# Patient Record
Sex: Female | Born: 1960 | Race: White | Hispanic: No | Marital: Married | Smoking: Current every day smoker
Health system: Southern US, Community
[De-identification: ages and names within clinical notes are randomized; demographics above are authoritative.]

## PROBLEM LIST (undated history)

## (undated) DIAGNOSIS — F419 Anxiety disorder, unspecified: Secondary | ICD-10-CM

## (undated) DIAGNOSIS — I10 Essential (primary) hypertension: Secondary | ICD-10-CM

## (undated) DIAGNOSIS — T7840XA Allergy, unspecified, initial encounter: Secondary | ICD-10-CM

## (undated) HISTORY — DX: Allergy, unspecified, initial encounter: T78.40XA

## (undated) HISTORY — DX: Anxiety disorder, unspecified: F41.9

---

## 2003-10-26 HISTORY — PX: ABDOMINAL HYSTERECTOMY: SHX81

## 2004-12-22 ENCOUNTER — Emergency Department (HOSPITAL_COMMUNITY): Admission: EM | Admit: 2004-12-22 | Discharge: 2004-12-23 | Payer: Self-pay | Admitting: *Deleted

## 2010-10-15 ENCOUNTER — Emergency Department (HOSPITAL_BASED_OUTPATIENT_CLINIC_OR_DEPARTMENT_OTHER)
Admission: EM | Admit: 2010-10-15 | Discharge: 2010-10-15 | Payer: Self-pay | Source: Home / Self Care | Admitting: Emergency Medicine

## 2010-11-04 ENCOUNTER — Emergency Department (HOSPITAL_BASED_OUTPATIENT_CLINIC_OR_DEPARTMENT_OTHER)
Admission: EM | Admit: 2010-11-04 | Discharge: 2010-11-04 | Payer: Self-pay | Source: Home / Self Care | Admitting: Emergency Medicine

## 2010-11-09 LAB — GLUCOSE, CAPILLARY: Glucose-Capillary: 107 mg/dL — ABNORMAL HIGH (ref 70–99)

## 2010-11-15 ENCOUNTER — Encounter: Payer: Self-pay | Admitting: Neurosurgery

## 2011-08-12 DIAGNOSIS — Z8614 Personal history of Methicillin resistant Staphylococcus aureus infection: Secondary | ICD-10-CM | POA: Insufficient documentation

## 2011-08-12 DIAGNOSIS — L02219 Cutaneous abscess of trunk, unspecified: Secondary | ICD-10-CM | POA: Insufficient documentation

## 2012-09-18 ENCOUNTER — Other Ambulatory Visit: Payer: Self-pay | Admitting: Internal Medicine

## 2012-09-18 NOTE — Telephone Encounter (Signed)
Not Internal Med pt. 

## 2012-09-24 ENCOUNTER — Other Ambulatory Visit: Payer: Self-pay | Admitting: Internal Medicine

## 2012-09-26 ENCOUNTER — Other Ambulatory Visit: Payer: Self-pay | Admitting: Internal Medicine

## 2015-03-26 ENCOUNTER — Ambulatory Visit (INDEPENDENT_AMBULATORY_CARE_PROVIDER_SITE_OTHER): Payer: 59 | Admitting: Family Medicine

## 2015-03-26 ENCOUNTER — Encounter: Payer: Self-pay | Admitting: Family Medicine

## 2015-03-26 VITALS — BP 108/67 | HR 62 | Temp 97.6°F | Resp 16 | Ht 65.5 in | Wt 174.2 lb

## 2015-03-26 DIAGNOSIS — M549 Dorsalgia, unspecified: Secondary | ICD-10-CM | POA: Diagnosis not present

## 2015-03-26 DIAGNOSIS — I10 Essential (primary) hypertension: Secondary | ICD-10-CM | POA: Diagnosis not present

## 2015-03-26 DIAGNOSIS — R229 Localized swelling, mass and lump, unspecified: Secondary | ICD-10-CM | POA: Diagnosis not present

## 2015-03-26 DIAGNOSIS — G8929 Other chronic pain: Secondary | ICD-10-CM

## 2015-03-26 DIAGNOSIS — R222 Localized swelling, mass and lump, trunk: Secondary | ICD-10-CM

## 2015-03-26 MED ORDER — HYDROCODONE-ACETAMINOPHEN 7.5-325 MG PO TABS: 2.0000 | ORAL_TABLET | Freq: Every day | ORAL | Status: AC

## 2015-03-26 MED ORDER — METOPROLOL TARTRATE 25 MG PO TABS: 25.0000 mg | ORAL_TABLET | Freq: Two times a day (BID) | ORAL | Status: AC
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
## 2015-03-26 NOTE — Patient Instructions (Signed)
Try to wean down to 1 Norco daily Take ibuprofen 200 mg over the counter- can take 3-4 every 8 hours Back Pain, Adult Low back pain is very common. About 1 in 5 people have back pain.The cause of low back pain is rarely dangerous. The pain often gets better over time.About half of people with a sudden onset of back pain feel better in just 2 weeks. About 8 in 10 people feel better by 6 weeks.  CAUSES Some common causes of back pain include:  Strain of the muscles or ligaments supporting the spine.  Wear and tear (degeneration) of the spinal discs.  Arthritis.  Direct injury to the back. DIAGNOSIS Most of the time, the direct cause of low back pain is not known.However, back pain can be treated effectively even when the exact cause of the pain is unknown.Answering your caregiver's questions about your overall health and symptoms is one of the most accurate ways to make sure the cause of your pain is not dangerous. If your caregiver needs more information, he or she may order lab work or imaging tests (X-rays or MRIs).However, even if imaging tests show changes in your back, this usually does not require surgery. HOME CARE INSTRUCTIONS For many people, back pain returns.Since low back pain is rarely dangerous, it is often a condition that people can learn to [facility] their own.   Remain active. It is stressful on the back to sit or stand in one place. Do not sit, drive, or stand in one place for more than 30 minutes at a time. Take short walks on level surfaces as soon as pain allows.Try to increase the length of time you walk each day.  Do not stay in bed.Resting more than 1 or 2 days can delay your recovery.  Do not avoid exercise or work.Your body is made to move.It is not dangerous to be active, even though your back may hurt.Your back will likely heal faster if you return to being active before your pain is gone.  Pay attention to your body when you bend and lift. Many people  have less discomfortwhen lifting if they bend their knees, keep the load close to their bodies,and avoid twisting. Often, the most comfortable positions are those that put less stress on your recovering back.  Find a comfortable position to sleep. Use a firm mattress and lie on your side with your knees slightly bent. If you lie on your back, put a pillow under your knees.  Only take over-the-counter or prescription medicines as directed by your caregiver. Over-the-counter medicines to reduce pain and inflammation are often the most helpful.Your caregiver may prescribe muscle relaxant drugs.These medicines help dull your pain so you can more quickly return to your normal activities and healthy exercise.  Put ice on the injured area.  Put ice in a plastic bag.  Place a towel between your skin and the bag.  Leave the ice on for 15-20 minutes, 03-04 times a day for the first 2 to 3 days. After that, ice and heat may be alternated to reduce pain and spasms.  Ask your caregiver about trying back exercises and gentle massage. This may be of some benefit.  Avoid feeling anxious or stressed.Stress increases muscle tension and can worsen back pain.It is important to recognize when you are anxious or stressed and learn ways to manage it.Exercise is a great option. SEEK MEDICAL CARE IF:  You have pain that is not relieved with rest or medicine.  You have  pain that does not improve in 1 week.  You have new symptoms.  You are generally not feeling well. SEEK IMMEDIATE MEDICAL CARE IF:   You have pain that radiates from your back into your legs.  You develop new bowel or bladder control problems.  You have unusual weakness or numbness in your arms or legs.  You develop nausea or vomiting.  You develop abdominal pain.  You feel faint. Document Released: 10/11/2005 Document Revised: 04/11/2012 Document Reviewed: 02/12/2014 [facility] Patient Information 2015 [place] [place]. This  information is not intended to replace advice given to you by your health care provider. Make sure you discuss any questions you have with your health care provider.

## 2015-03-30 ENCOUNTER — Encounter: Payer: Self-pay | Admitting: Family Medicine

## 2015-04-21 ENCOUNTER — Telehealth: Payer: Self-pay

## 2015-04-21 DIAGNOSIS — R222 Localized swelling, mass and lump, trunk: Secondary | ICD-10-CM

## 2015-04-21 NOTE — Telephone Encounter (Signed)
That will be fine. I have put in the order for the referral.

## 2015-04-21 NOTE — Telephone Encounter (Signed)
Patient is requesting a referral to [facility] Bethanie DickerJoshua Butler [phone]. Per patient she has already seen a general surgeon in regards to her mass on her back. She was told by [facility] she would need to see a dermatologist. She has [facility] and it requires a referral authorization. Patients call back number is [phone]

## 2015-04-21 NOTE — Telephone Encounter (Signed)
Can we refer? 

## 2015-04-22 NOTE — Telephone Encounter (Signed)
Left a detailed message advising pt that referral was sent.

## 2015-05-01 ENCOUNTER — Telehealth: Payer: Self-pay

## 2015-05-01 NOTE — Telephone Encounter (Signed)
Patient states she called yesterday for a refill of HYDROcodone-acetaminophen (NORCO) 7.5-325 MG per tablet  She is on her way to be seen in order to get a refill.

## 2015-05-06 ENCOUNTER — Encounter: Payer: 59 | Admitting: Family Medicine

## 2018-12-31 ENCOUNTER — Emergency Department (HOSPITAL_COMMUNITY): Payer: BLUE CROSS/BLUE SHIELD

## 2018-12-31 ENCOUNTER — Encounter (HOSPITAL_COMMUNITY): Payer: Self-pay | Admitting: Emergency Medicine

## 2018-12-31 ENCOUNTER — Inpatient Hospital Stay (HOSPITAL_COMMUNITY): Payer: BLUE CROSS/BLUE SHIELD

## 2018-12-31 ENCOUNTER — Inpatient Hospital Stay (HOSPITAL_COMMUNITY)
Admission: EM | Admit: 2018-12-31 | Discharge: 2019-01-24 | DRG: 917 | Disposition: E | Payer: BLUE CROSS/BLUE SHIELD | Attending: Pulmonary Disease | Admitting: Pulmonary Disease

## 2018-12-31 DIAGNOSIS — G4700 Insomnia, unspecified: Secondary | ICD-10-CM | POA: Diagnosis present

## 2018-12-31 DIAGNOSIS — J9690 Respiratory failure, unspecified, unspecified whether with hypoxia or hypercapnia: Secondary | ICD-10-CM | POA: Diagnosis not present

## 2018-12-31 DIAGNOSIS — E872 Acidosis: Secondary | ICD-10-CM | POA: Diagnosis present

## 2018-12-31 DIAGNOSIS — T405X1A Poisoning by cocaine, accidental (unintentional), initial encounter: Secondary | ICD-10-CM | POA: Diagnosis present

## 2018-12-31 DIAGNOSIS — E669 Obesity, unspecified: Secondary | ICD-10-CM | POA: Diagnosis present

## 2018-12-31 DIAGNOSIS — Z6831 Body mass index (BMI) 31.0-31.9, adult: Secondary | ICD-10-CM | POA: Diagnosis not present

## 2018-12-31 DIAGNOSIS — I639 Cerebral infarction, unspecified: Secondary | ICD-10-CM | POA: Diagnosis not present

## 2018-12-31 DIAGNOSIS — R740 Nonspecific elevation of levels of transaminase and lactic acid dehydrogenase [LDH]: Secondary | ICD-10-CM | POA: Diagnosis not present

## 2018-12-31 DIAGNOSIS — G9341 Metabolic encephalopathy: Secondary | ICD-10-CM | POA: Diagnosis present

## 2018-12-31 DIAGNOSIS — G931 Anoxic brain damage, not elsewhere classified: Secondary | ICD-10-CM | POA: Diagnosis present

## 2018-12-31 DIAGNOSIS — Z79899 Other long term (current) drug therapy: Secondary | ICD-10-CM

## 2018-12-31 DIAGNOSIS — F1410 Cocaine abuse, uncomplicated: Secondary | ICD-10-CM | POA: Diagnosis present

## 2018-12-31 DIAGNOSIS — Z9289 Personal history of other medical treatment: Secondary | ICD-10-CM

## 2018-12-31 DIAGNOSIS — K5900 Constipation, unspecified: Secondary | ICD-10-CM | POA: Diagnosis present

## 2018-12-31 DIAGNOSIS — R4189 Other symptoms and signs involving cognitive functions and awareness: Secondary | ICD-10-CM | POA: Diagnosis not present

## 2018-12-31 DIAGNOSIS — I10 Essential (primary) hypertension: Secondary | ICD-10-CM | POA: Diagnosis present

## 2018-12-31 DIAGNOSIS — D72829 Elevated white blood cell count, unspecified: Secondary | ICD-10-CM | POA: Diagnosis not present

## 2018-12-31 DIAGNOSIS — Y95 Nosocomial condition: Secondary | ICD-10-CM | POA: Diagnosis present

## 2018-12-31 DIAGNOSIS — Z90710 Acquired absence of both cervix and uterus: Secondary | ICD-10-CM | POA: Diagnosis not present

## 2018-12-31 DIAGNOSIS — N179 Acute kidney failure, unspecified: Secondary | ICD-10-CM

## 2018-12-31 DIAGNOSIS — R569 Unspecified convulsions: Secondary | ICD-10-CM | POA: Diagnosis not present

## 2018-12-31 DIAGNOSIS — I468 Cardiac arrest due to other underlying condition: Secondary | ICD-10-CM | POA: Diagnosis present

## 2018-12-31 DIAGNOSIS — J810 Acute pulmonary edema: Secondary | ICD-10-CM | POA: Diagnosis not present

## 2018-12-31 DIAGNOSIS — F17210 Nicotine dependence, cigarettes, uncomplicated: Secondary | ICD-10-CM | POA: Diagnosis present

## 2018-12-31 DIAGNOSIS — T402X1A Poisoning by other opioids, accidental (unintentional), initial encounter: Secondary | ICD-10-CM | POA: Diagnosis present

## 2018-12-31 DIAGNOSIS — Z9911 Dependence on respirator [ventilator] status: Secondary | ICD-10-CM | POA: Diagnosis not present

## 2018-12-31 DIAGNOSIS — J9601 Acute respiratory failure with hypoxia: Secondary | ICD-10-CM | POA: Diagnosis present

## 2018-12-31 DIAGNOSIS — J15211 Pneumonia due to Methicillin susceptible Staphylococcus aureus: Secondary | ICD-10-CM | POA: Diagnosis present

## 2018-12-31 DIAGNOSIS — K7201 Acute and subacute hepatic failure with coma: Secondary | ICD-10-CM | POA: Diagnosis present

## 2018-12-31 DIAGNOSIS — Z515 Encounter for palliative care: Secondary | ICD-10-CM | POA: Diagnosis not present

## 2018-12-31 DIAGNOSIS — N170 Acute kidney failure with tubular necrosis: Secondary | ICD-10-CM | POA: Diagnosis present

## 2018-12-31 DIAGNOSIS — Z7189 Other specified counseling: Secondary | ICD-10-CM

## 2018-12-31 DIAGNOSIS — I6330 Cerebral infarction due to thrombosis of unspecified cerebral artery: Secondary | ICD-10-CM

## 2018-12-31 DIAGNOSIS — R6521 Severe sepsis with septic shock: Secondary | ICD-10-CM | POA: Diagnosis not present

## 2018-12-31 DIAGNOSIS — R509 Fever, unspecified: Secondary | ICD-10-CM | POA: Diagnosis not present

## 2018-12-31 DIAGNOSIS — E785 Hyperlipidemia, unspecified: Secondary | ICD-10-CM | POA: Diagnosis present

## 2018-12-31 DIAGNOSIS — N3000 Acute cystitis without hematuria: Secondary | ICD-10-CM | POA: Diagnosis not present

## 2018-12-31 DIAGNOSIS — F319 Bipolar disorder, unspecified: Secondary | ICD-10-CM | POA: Diagnosis present

## 2018-12-31 DIAGNOSIS — Z79891 Long term (current) use of opiate analgesic: Secondary | ICD-10-CM | POA: Diagnosis not present

## 2018-12-31 DIAGNOSIS — Z66 Do not resuscitate: Secondary | ICD-10-CM | POA: Diagnosis not present

## 2018-12-31 DIAGNOSIS — A419 Sepsis, unspecified organism: Secondary | ICD-10-CM | POA: Diagnosis not present

## 2018-12-31 DIAGNOSIS — F17200 Nicotine dependence, unspecified, uncomplicated: Secondary | ICD-10-CM | POA: Diagnosis not present

## 2018-12-31 DIAGNOSIS — R0602 Shortness of breath: Secondary | ICD-10-CM | POA: Diagnosis present

## 2018-12-31 DIAGNOSIS — D6489 Other specified anemias: Secondary | ICD-10-CM | POA: Diagnosis present

## 2018-12-31 DIAGNOSIS — I469 Cardiac arrest, cause unspecified: Secondary | ICD-10-CM | POA: Diagnosis not present

## 2018-12-31 DIAGNOSIS — Z4659 Encounter for fitting and adjustment of other gastrointestinal appliance and device: Secondary | ICD-10-CM

## 2018-12-31 DIAGNOSIS — R578 Other shock: Secondary | ICD-10-CM | POA: Diagnosis present

## 2018-12-31 HISTORY — DX: Essential (primary) hypertension: I10

## 2018-12-31 LAB — I-STAT TROPONIN, ED: Troponin i, poc: 0.07 ng/mL (ref 0.00–0.08)

## 2018-12-31 LAB — URINALYSIS, ROUTINE W REFLEX MICROSCOPIC
Bacteria, UA: NONE SEEN
Bilirubin Urine: NEGATIVE
Glucose, UA: 150 mg/dL — AB
Ketones, ur: NEGATIVE mg/dL
Leukocytes,Ua: NEGATIVE
Nitrite: NEGATIVE
Protein, ur: 100 mg/dL — AB
Specific Gravity, Urine: 1.016 (ref 1.005–1.030)
pH: 5 (ref 5.0–8.0)

## 2018-12-31 LAB — COMPREHENSIVE METABOLIC PANEL
ALT: 115 U/L — ABNORMAL HIGH (ref 0–44)
AST: 146 U/L — ABNORMAL HIGH (ref 15–41)
Albumin: 4.2 g/dL (ref 3.5–5.0)
Alkaline Phosphatase: 75 U/L (ref 38–126)
Anion gap: 21 — ABNORMAL HIGH (ref 5–15)
BUN: 31 mg/dL — ABNORMAL HIGH (ref 6–20)
CO2: 21 mmol/L — ABNORMAL LOW (ref 22–32)
Calcium: 8.5 mg/dL — ABNORMAL LOW (ref 8.9–10.3)
Chloride: 103 mmol/L (ref 98–111)
Creatinine, Ser: 4.23 mg/dL — ABNORMAL HIGH (ref 0.44–1.00)
GFR calc Af Amer: 13 mL/min — ABNORMAL LOW (ref >60)
GFR calc non Af Amer: 11 mL/min — ABNORMAL LOW (ref >60)
Glucose, Bld: 132 mg/dL — ABNORMAL HIGH (ref 70–99)
Potassium: 4.6 mmol/L (ref 3.5–5.1)
Sodium: 145 mmol/L (ref 135–145)
Total Bilirubin: 0.6 mg/dL (ref 0.3–1.2)
Total Protein: 7.5 g/dL (ref 6.5–8.1)

## 2018-12-31 LAB — CBC WITH DIFFERENTIAL/PLATELET
Abs Immature Granulocytes: 0.21 10*3/uL — ABNORMAL HIGH (ref 0.00–0.07)
Basophils Absolute: 0.1 10*3/uL (ref 0.0–0.1)
Basophils Relative: 0 %
Eosinophils Absolute: 0.2 10*3/uL (ref 0.0–0.5)
Eosinophils Relative: 1 %
HCT: 47.6 % — ABNORMAL HIGH (ref 36.0–46.0)
Hemoglobin: 14 g/dL (ref 12.0–15.0)
Immature Granulocytes: 1 %
Lymphocytes Relative: 13 %
Lymphs Abs: 2.9 10*3/uL (ref 0.7–4.0)
MCH: 28.3 pg (ref 26.0–34.0)
MCHC: 29.4 g/dL — ABNORMAL LOW (ref 30.0–36.0)
MCV: 96.4 fL (ref 80.0–100.0)
Monocytes Absolute: 1.7 10*3/uL — ABNORMAL HIGH (ref 0.1–1.0)
Monocytes Relative: 8 %
Neutro Abs: 16.9 10*3/uL — ABNORMAL HIGH (ref 1.7–7.7)
Neutrophils Relative %: 77 %
Platelets: 312 10*3/uL (ref 150–400)
RBC: 4.94 MIL/uL (ref 3.87–5.11)
RDW: 14.2 % (ref 11.5–15.5)
WBC: 22 10*3/uL — ABNORMAL HIGH (ref 4.0–10.5)
nRBC: 0 % (ref 0.0–0.2)

## 2018-12-31 LAB — OSMOLALITY: OSMOLALITY: 312 mosm/kg — AB (ref 275–295)

## 2018-12-31 LAB — BLOOD GAS, ARTERIAL
Acid-base deficit: 3.9 mmol/L — ABNORMAL HIGH (ref 0.0–2.0)
Bicarbonate: 21.7 mmol/L (ref 20.0–28.0)
DRAWN BY: 244901
FIO2: 100
MECHVT: 510 mL
O2 Saturation: 99.1 %
PCO2 ART: 47.2 mmHg (ref 32.0–48.0)
PEEP: 5 cmH2O
Patient temperature: 98.5
RATE: 22 resp/min
pH, Arterial: 7.284 — ABNORMAL LOW (ref 7.350–7.450)
pO2, Arterial: 245 mmHg — ABNORMAL HIGH (ref 83.0–108.0)

## 2018-12-31 LAB — GLUCOSE, CAPILLARY
Glucose-Capillary: 105 mg/dL — ABNORMAL HIGH (ref 70–99)
Glucose-Capillary: 79 mg/dL (ref 70–99)

## 2018-12-31 LAB — LACTIC ACID, PLASMA
LACTIC ACID, VENOUS: 8.4 mmol/L — AB (ref 0.5–1.9)
Lactic Acid, Venous: 4.2 mmol/L (ref 0.5–1.9)
Lactic Acid, Venous: 3.1 mmol/L (ref 0.5–1.9)

## 2018-12-31 LAB — TRIGLYCERIDES: Triglycerides: 109 mg/dL (ref <150)

## 2018-12-31 LAB — ETHANOL: Alcohol, Ethyl (B): <10 mg/dL (ref <10)

## 2018-12-31 LAB — PROTIME-INR
INR: 1.1 (ref 0.8–1.2)
INR: 1.1 (ref 0.8–1.2)
PROTHROMBIN TIME: 13.7 s (ref 11.4–15.2)
Prothrombin Time: 13.8 seconds (ref 11.4–15.2)

## 2018-12-31 LAB — RAPID URINE DRUG SCREEN, HOSP PERFORMED
Amphetamines: NOT DETECTED
Barbiturates: NOT DETECTED
Benzodiazepines: NOT DETECTED
Cocaine: POSITIVE — AB
Opiates: POSITIVE — AB
Tetrahydrocannabinol: NOT DETECTED

## 2018-12-31 LAB — ACETAMINOPHEN LEVEL

## 2018-12-31 LAB — APTT: aPTT: 22 seconds — ABNORMAL LOW (ref 24–36)

## 2018-12-31 LAB — I-STAT BETA HCG BLOOD, ED (MC, WL, AP ONLY): I-stat hCG, quantitative: <5 m[IU]/mL (ref <5)

## 2018-12-31 LAB — TROPONIN I: Troponin I: 0.08 ng/mL (ref <0.03)

## 2018-12-31 LAB — CORTISOL: Cortisol, Plasma: 41.9 ug/dL

## 2018-12-31 LAB — BRAIN NATRIURETIC PEPTIDE: B Natriuretic Peptide: 1725 pg/mL — ABNORMAL HIGH (ref 0.0–100.0)

## 2018-12-31 LAB — TSH: TSH: 0.89 u[IU]/mL (ref 0.350–4.500)

## 2018-12-31 LAB — INFLUENZA PANEL BY PCR (TYPE A & B)
INFLAPCR: NEGATIVE
Influenza B By PCR: NEGATIVE

## 2018-12-31 LAB — CREATININE, URINE, RANDOM: Creatinine, Urine: 130.19 mg/dL

## 2018-12-31 LAB — SALICYLATE LEVEL: Salicylate Lvl: <7 mg/dL (ref 2.8–30.0)

## 2018-12-31 LAB — MAGNESIUM: Magnesium: 2.6 mg/dL — ABNORMAL HIGH (ref 1.7–2.4)

## 2018-12-31 LAB — SODIUM, URINE, RANDOM: Sodium, Ur: 66 mmol/L

## 2018-12-31 LAB — PROCALCITONIN: Procalcitonin: 3.73 ng/mL

## 2018-12-31 LAB — STREP PNEUMONIAE URINARY ANTIGEN: Strep Pneumo Urinary Antigen: NEGATIVE

## 2018-12-31 MED ORDER — EPINEPHRINE PF 1 MG/ML IJ SOLN
0.5000 ug/min | INTRAVENOUS | Status: DC
  Administered 2018-12-31: 2 ug/min via INTRAVENOUS
  Filled 2018-12-31: qty 4

## 2018-12-31 MED ORDER — VANCOMYCIN HCL 10 G IV SOLR
1750.0000 mg | Freq: Once | INTRAVENOUS | Status: AC
  Administered 2018-12-31: 1750 mg via INTRAVENOUS
  Filled 2018-12-31: qty 1750

## 2018-12-31 MED ORDER — ROCURONIUM BROMIDE 50 MG/5ML IV SOLN
INTRAVENOUS | Status: AC | PRN
  Administered 2018-12-31: 100 mg via INTRAVENOUS

## 2018-12-31 MED ORDER — NOREPINEPHRINE 4 MG/250ML-% IV SOLN
0.0000 ug/min | INTRAVENOUS | Status: DC
  Administered 2018-12-31: 10 ug/min via INTRAVENOUS
  Administered 2019-01-01: 6 ug/min via INTRAVENOUS
  Filled 2018-12-31 (×3): qty 250

## 2018-12-31 MED ORDER — MIDAZOLAM HCL 2 MG/2ML IJ SOLN
2.0000 mg | INTRAMUSCULAR | Status: DC | PRN
  Administered 2019-01-03 – 2019-01-11 (×30): 2 mg via INTRAVENOUS
  Filled 2018-12-31 (×31): qty 2

## 2018-12-31 MED ORDER — SODIUM CHLORIDE 0.9 % IV SOLN
INTRAVENOUS | Status: DC
  Administered 2018-12-31 – 2019-01-07 (×11): via INTRAVENOUS

## 2018-12-31 MED ORDER — PROPOFOL 1000 MG/100ML IV EMUL
5.0000 ug/kg/min | INTRAVENOUS | Status: DC
  Administered 2018-12-31: 10 ug/kg/min via INTRAVENOUS

## 2018-12-31 MED ORDER — INSULIN ASPART 100 UNIT/ML ~~LOC~~ SOLN
1.0000 [IU] | SUBCUTANEOUS | Status: DC
  Administered 2019-01-01 – 2019-01-08 (×17): 1 [IU] via SUBCUTANEOUS
  Administered 2019-01-08: 2 [IU] via SUBCUTANEOUS
  Administered 2019-01-09: 1 [IU] via SUBCUTANEOUS
  Administered 2019-01-09: 2 [IU] via SUBCUTANEOUS
  Administered 2019-01-09 (×2): 1 [IU] via SUBCUTANEOUS
  Administered 2019-01-09 (×2): 2 [IU] via SUBCUTANEOUS
  Administered 2019-01-09 – 2019-01-10 (×3): 1 [IU] via SUBCUTANEOUS
  Administered 2019-01-10: 2 [IU] via SUBCUTANEOUS
  Administered 2019-01-10 – 2019-01-15 (×18): 1 [IU] via SUBCUTANEOUS

## 2018-12-31 MED ORDER — PROPOFOL 1000 MG/100ML IV EMUL
INTRAVENOUS | Status: AC
  Administered 2018-12-31: 10 ug/kg/min via INTRAVENOUS
  Filled 2018-12-31: qty 100

## 2018-12-31 MED ORDER — PIPERACILLIN-TAZOBACTAM 3.375 G IVPB
3.3750 g | Freq: Two times a day (BID) | INTRAVENOUS | Status: DC
  Administered 2019-01-01 – 2019-01-02 (×3): 3.375 g via INTRAVENOUS
  Filled 2018-12-31 (×3): qty 50

## 2018-12-31 MED ORDER — CHLORHEXIDINE GLUCONATE 0.12% ORAL RINSE (MEDLINE KIT)
15.0000 mL | Freq: Two times a day (BID) | OROMUCOSAL | Status: DC
  Administered 2018-12-31 – 2019-01-15 (×30): 15 mL via OROMUCOSAL

## 2018-12-31 MED ORDER — DEXMEDETOMIDINE HCL IN NACL 200 MCG/50ML IV SOLN
0.4000 ug/kg/h | INTRAVENOUS | Status: DC
  Filled 2018-12-31: qty 50

## 2018-12-31 MED ORDER — PANTOPRAZOLE SODIUM 40 MG IV SOLR
40.0000 mg | INTRAVENOUS | Status: DC
  Administered 2018-12-31 – 2019-01-01 (×2): 40 mg via INTRAVENOUS
  Filled 2018-12-31 (×2): qty 40

## 2018-12-31 MED ORDER — ETOMIDATE 2 MG/ML IV SOLN
INTRAVENOUS | Status: AC | PRN
  Administered 2018-12-31: 20 mg via INTRAVENOUS

## 2018-12-31 MED ORDER — ORAL CARE MOUTH RINSE
15.0000 mL | OROMUCOSAL | Status: DC
  Administered 2018-12-31 – 2019-01-15 (×139): 15 mL via OROMUCOSAL

## 2018-12-31 MED ORDER — PROPOFOL 1000 MG/100ML IV EMUL
0.0000 ug/kg/min | INTRAVENOUS | Status: DC
  Administered 2019-01-01: 5 ug/kg/min via INTRAVENOUS

## 2018-12-31 MED ORDER — VANCOMYCIN VARIABLE DOSE PER UNSTABLE RENAL FUNCTION (PHARMACIST DOSING): Status: DC

## 2018-12-31 MED ORDER — FENTANYL CITRATE (PF) 100 MCG/2ML IJ SOLN
100.0000 ug | INTRAMUSCULAR | Status: DC | PRN
  Administered 2019-01-03 – 2019-01-14 (×35): 100 ug via INTRAVENOUS
  Filled 2018-12-31 (×37): qty 2

## 2018-12-31 MED ORDER — PIPERACILLIN-TAZOBACTAM 3.375 G IVPB 30 MIN
3.3750 g | Freq: Once | INTRAVENOUS | Status: AC
  Administered 2018-12-31: 3.375 g via INTRAVENOUS
  Filled 2018-12-31: qty 50
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
## 2018-12-31 NOTE — Progress Notes (Signed)
Pharmacy Antibiotic Note  Rita Santos is a 58 y.o. female admitted on 01/13/2019 with sepsis and aspiration PNA.  Pharmacy has been consulted for zosyn and vancomycin dosing.  Was found unresponsive now s/p CPR with unknown downtime. Concern for sepsis/aspiration PNA - WBC 22, LA 8.4, afebrile. Scr 4.23 (CrCl 16.4 mL/min).   Plan: Vancomycin 1750 mg IV once   Monitor vancomycin random to determine clearance Order zosyn 3.375 g IV every 12 hours Monitor renal fx, cx results, clinical pic, and for s/sx of bleeding  Height: 5\' 6"  (167.6 cm) Weight: 190 lb (86.2 kg) IBW/kg (Calculated) : 59.3  Temp (24hrs), Avg:97.7 F (36.5 C), Min:97.7 F (36.5 C), Max:97.7 F (36.5 C)  Recent Labs  Lab 12/25/2018 1434 12/30/2018 1435  WBC 22.0*  --   CREATININE 4.23*  --   LATICACIDVEN  --  8.4*    Estimated Creatinine Clearance: 16.4 mL/min (A) (by C-G formula based on SCr of 4.23 mg/dL (H)).    No Known Allergies  Antimicrobials this admission: Zosyn 3/8 >>  Vancomycin 3/8 >>   Dose adjustments this admission: N/A  Microbiology results: 3/8 BCx: sent 3/8 UCx: sent  3/8 Sputum: sent  3/8 Resp PCR: sent  Thank you for allowing pharmacy to be a part of this patient's care.  Sherron Monday, PharmD, BCCCP Clinical Pharmacist  Pager: [phone] Phone: [phone] 01/20/2019 4:42 PM
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
## 2018-12-31 NOTE — ED Provider Notes (Signed)
[facility] EMERGENCY DEPARTMENT Provider Note   CSN: [id number] Arrival date & time: 01/13/2019  1417    History   Chief Complaint Chief Complaint  Patient presents with  . post CPR    HPI Rita Santos is a 58 y.o. female.     HPI Presents in extremis via EMS after being found unresponsive. Patient is incapable of providing details of the HPI, level 5 caveat secondary to acuity of condition.  EMS reports that the patient was found just prior to notification, after last being seen normal about 14 hours previously. Reported the patient was demonstrating no interactivity, had no pulses, had agonal breathing. Patient received CPR, by fire department personnel, had return of spontaneous circulation after the first cycle. Patient had some posturing in route, received epinephrine drip, had no return to normal interactivity, though she did begin to exhibit some spontaneous respiratory effort. Patient also received Narcan in route.    Past Medical History:  Diagnosis Date  . Allergy   . Anxiety   . Hypertension     Patient Active Problem List   Diagnosis Date Noted  . Personal history of methicillin resistant Staphylococcus aureus 08/12/2011  . Cellulitis and abscess of trunk 08/12/2011    Past Surgical History:  Procedure Laterality Date  . ABDOMINAL HYSTERECTOMY  2005  . CESAREAN SECTION  1989     OB History   No obstetric history on file.      Home Medications    Prior to Admission medications   Medication Sig Start Date End Date Taking? Authorizing Provider  HYDROcodone-acetaminophen (NORCO) 7.5-325 MG per tablet Take 2 tablets by mouth daily. 03/26/15   Emi Belfast, FNP  metoprolol tartrate (LOPRESSOR) 25 MG tablet Take 1 tablet (25 mg total) by mouth 2 (two) times daily. 03/26/15   Emi Belfast, FNP  traZODone (DESYREL) 50 MG tablet Take 50 mg by mouth at bedtime as needed for sleep.    [provider]    Family  History Family History  Problem Relation Age of Onset  . Hypertension Mother   . Hypertension Sister   . Cancer Paternal Grandfather        lung    Social History Social History   Tobacco Use  . Smoking status: Current Every Day Smoker    Packs/day: 0.25    Years: 18.00    Pack years: 4.50    Types: Cigarettes  . Smokeless tobacco: Never Used  Substance Use Topics  . Alcohol use: Yes    Alcohol/week: 0.0 standard drinks    Comment: occasionally  . Drug use: No     Allergies   Patient has no known allergies.   Review of Systems Review of Systems  Unable to perform ROS: Acuity of condition     Physical Exam Updated Vital Signs BP (!) 83/69   Pulse 97   Temp 97.7 F (36.5 C) (Rectal)   Resp (!) 21   Ht  (1.676 m)   Wt 86.2 kg   SpO2 99%   BMI 30.67 kg/m   Physical Exam Vitals signs and nursing note reviewed.  Constitutional:      General: She is in acute distress.     Appearance: She is well-developed. She is ill-appearing.     Comments: Large female awake and alert unresponsive  HENT:     Head: Normocephalic and atraumatic.  Eyes:     Comments: Pupils 1/2 mm bilaterally, does not track, difficult to ascertain reactivity.  Cardiovascular:     Rate and Rhythm: Regular rhythm. Tachycardia present.  Pulmonary:     Breath sounds: Rhonchi present.     Comments: Breath sounds audible with assisted ventilation Abdominal:     General: There is no distension.  Skin:    General: Skin is warm and dry.  Neurological:     Mental Status: She is alert.     Cranial Nerves: No cranial nerve deficit.     Comments: Unresponsive, some spontaneous motion.  Psychiatric:     Comments: Incapacitated      ED Treatments / Results  Labs (all labs ordered are listed, but only abnormal results are displayed) Labs Reviewed  COMPREHENSIVE METABOLIC PANEL - Abnormal; Notable for the following components:      Result Value   CO2 21 (*)    Glucose, Bld 132 (*)      BUN 31 (*)    Creatinine, Ser 4.23 (*)    Calcium 8.5 (*)    AST 146 (*)    ALT 115 (*)    GFR calc non Af Amer 11 (*)    GFR calc Af Amer 13 (*)    Anion gap 21 (*)    All other components within normal limits  MAGNESIUM - Abnormal; Notable for the following components:   Magnesium 2.6 (*)    All other components within normal limits  TROPONIN I - Abnormal; Notable for the following components:   Troponin I 0.08 (*)    All other components within normal limits  CBC WITH DIFFERENTIAL/PLATELET - Abnormal; Notable for the following components:   WBC 22.0 (*)    HCT 47.6 (*)    MCHC 29.4 (*)    Neutro Abs 16.9 (*)    Monocytes Absolute 1.7 (*)    Abs Immature Granulocytes 0.21 (*)    All other components within normal limits  LACTIC ACID, PLASMA - Abnormal; Notable for the following components:   Lactic Acid, Venous 8.4 (*)    All other components within normal limits  PROTIME-INR  LACTIC ACID, PLASMA  BRAIN NATRIURETIC PEPTIDE  TRIGLYCERIDES  CBG MONITORING, ED  I-STAT BETA HCG BLOOD, ED ([facility], [facility], [facility] ONLY)  I-STAT TROPONIN, ED    EKG EKG Interpretation  Date/Time:  Sunday December 31 2018 14:22:27 EDT Ventricular Rate:  119 PR Interval:    QRS Duration: 132 QT Interval:  323 QTC Calculation: 455 R Axis:   72 Text Interpretation:  Sinus tachycardia Ventricular premature complex LAE, consider biatrial enlargement Nonspecific intraventricular conduction delay ST-t wave abnormality Premature ventricular complexes Abnormal ekg Confirmed by Gerhard Munch [phone]) on 12/26/2018 3:44:23 PM   Radiology Ct Head Wo Contrast  Result Date: 01/15/2019 CLINICAL DATA:  Loss of consciousness, post arrest EXAM: CT HEAD WITHOUT CONTRAST TECHNIQUE: Contiguous axial images were obtained from the base of the skull through the vertex without intravenous contrast. COMPARISON:  None. FINDINGS: Brain: No evidence of acute infarction, hemorrhage, hydrocephalus, extra-axial collection or mass  lesion/mass effect. Periventricular white matter hypodensity. Vascular: No hyperdense vessel or unexpected calcification. Skull: Normal. Negative for fracture or focal lesion. Sinuses/Orbits: No acute finding. Other: None. IMPRESSION: No acute intracranial pathology.  Small-vessel white matter disease. Electronically Signed   By: Lauralyn Primes M.D.   On: 12/30/2018 15:34   Dg Chest Portable 1 View  Result Date: 01/01/2019 CLINICAL DATA:  Evaluate ETT and NG tube placement EXAM: PORTABLE CHEST 1 VIEW COMPARISON:  December 22, 2004 FINDINGS: The ETT terminates 8 mm above the carina. Recommend  withdrawing 2 cm. The NG tube terminates below today's film, within the abdomen. No pneumothorax. The right lung is clear. Mild patchy hazy opacity in the left lung is nonspecific. The cardiomediastinal silhouette is unremarkable. IMPRESSION: 1. The ETT terminates near the carina. Recommend withdrawing 2 cm. The NG tube terminates below today's film. 2. Mild hazy patchy opacity in left lung could represent atelectasis due to mildly reduced volumes and patchy atelectasis versus a developing infiltrate. Recommend attention on follow-up. 3. No other acute abnormalities. Findings called to Dr. Jeraldine Loots. Electronically Signed   By: Gerome Sam III M.D   On: 2019/01/22 15:18    Procedures Procedures (including critical care time)  INTUBATION Performed by: Gerhard Munch  Required items: required blood products, implants, devices, and special equipment available Patient identity confirmed: provided demographic data and hospital-assigned identification number Time out: Immediately prior to procedure a "time out" was called to verify the correct patient, procedure, equipment, support staff and site/side marked as required.  Indications: airway protection  Intubation method: Glidescope Laryngoscopy   Preoxygenation: BVM  Sedatives: 20Etomidate Paralytic: 100 rocuronium  tube Size: 7.5 cuffed  Post-procedure  assessment: chest rise and ETCO2 monitor Breath sounds: equal and absent over the epigastrium Tube secured with: ETT holder Chest x-ray interpreted by radiologist and me.  Chest x-ray findings: endotracheal tube in appropriate position - withdrawn 1 cm  Patient tolerated the procedure well with no immediate complications.     Medications Ordered in ED Medications  propofol (DIPRIVAN) 1000 MG/100ML infusion (10 mcg/kg/min  86.2 kg Intravenous New Bag/Given 22-Jan-2019 1532)  dexmedetomidine (PRECEDEX) 200 MCG/50ML (4 mcg/mL) infusion (has no administration in time range)  EPINEPHrine (ADRENALIN) 4 mg in dextrose 5 % 250 mL (0.016 mg/mL) infusion (has no administration in time range)  etomidate (AMIDATE) injection (20 mg Intravenous Given 22-Jan-2019 1425)  rocuronium (ZEMURON) injection (100 mg Intravenous Given Jan 22, 2019 1426)     Initial Impression / Assessment and Plan / ED Course  I have reviewed the triage vital signs and the nursing notes.  Pertinent labs & imaging results that were available during my care of the patient were reviewed by me and considered in my medical decision making (see chart for details).    After the initial evaluation with concern for airway protection, the patient had emergent intubation.  See procedure note above.    3:44 PM Patient has had some decrease in blood pressure, propofol being reduced, as needed, patient will start epinephrine. I discussed the patient's case with our critical care colleagues. Given the duration of time since last being seen normal, she is not a candidate for cooling.  Patient presents after being found unresponsive, requiring CPR for return of spontaneous circulation Here the patient was listless, neurologically nonparticipapatory Patient required intubation for airway protection, labs sent, fluids provided after the patient became hypotensive. Patient's initial labs notable for creatinine 4, positive troponin, positive lactic  acidosis. Patient required admission to the ICU for further monitoring, management of her epinephrine, Precedex, following presentation for unresponsiveness.   Final Clinical Impressions(s) / ED Diagnoses  Unresponsiveness  CRITICAL CARE Performed by: Gerhard Munch Total critical care time: 40 minutes Critical care time was exclusive of separately billable procedures and treating other patients. Critical care was necessary to treat or prevent imminent or life-threatening deterioration. Critical care was time spent personally by me on the following activities: development of treatment plan with patient and/or surrogate as well as nursing, discussions with consultants, evaluation of patient's response to treatment, examination of patient,  obtaining history from patient or surrogate, ordering and performing treatments and interventions, ordering and review of laboratory studies, ordering and review of radiographic studies, pulse oximetry and re-evaluation of patient's condition.    Gerhard Munch, MD 01/03/2019 [phone]

## 2018-12-31 NOTE — Progress Notes (Signed)
CRITICAL VALUE ALERT  Critical Value:  3.1 lactic acid  Date & Time Notied:  01/01/2019  2012  Provider Notified: E-link MD  Orders Received/Actions taken: Awaiting orders

## 2018-12-31 NOTE — Progress Notes (Signed)
Patient intubated for airway protection by ED physician with a 7.5 ETT taped at 23 cm at lip, good color change on ETCO2 detector, good BBS ausculted, SATS 100%, placed on above vent settings per ARDS net protocol, will continue to monitor patient.

## 2019-01-01 ENCOUNTER — Inpatient Hospital Stay (HOSPITAL_COMMUNITY): Payer: BLUE CROSS/BLUE SHIELD

## 2019-01-01 DIAGNOSIS — R6521 Severe sepsis with septic shock: Secondary | ICD-10-CM

## 2019-01-01 DIAGNOSIS — A419 Sepsis, unspecified organism: Secondary | ICD-10-CM

## 2019-01-01 DIAGNOSIS — N179 Acute kidney failure, unspecified: Secondary | ICD-10-CM

## 2019-01-01 DIAGNOSIS — R4189 Other symptoms and signs involving cognitive functions and awareness: Secondary | ICD-10-CM

## 2019-01-01 DIAGNOSIS — J9601 Acute respiratory failure with hypoxia: Secondary | ICD-10-CM

## 2019-01-01 DIAGNOSIS — N3000 Acute cystitis without hematuria: Secondary | ICD-10-CM

## 2019-01-01 LAB — MAGNESIUM
Magnesium: 1.8 mg/dL (ref 1.7–2.4)
Magnesium: 1.9 mg/dL (ref 1.7–2.4)
Magnesium: 2 mg/dL (ref 1.7–2.4)

## 2019-01-01 LAB — BASIC METABOLIC PANEL
Anion gap: 11 (ref 5–15)
BUN: 40 mg/dL — ABNORMAL HIGH (ref 6–20)
CO2: 21 mmol/L — ABNORMAL LOW (ref 22–32)
Calcium: 8.2 mg/dL — ABNORMAL LOW (ref 8.9–10.3)
Chloride: 112 mmol/L — ABNORMAL HIGH (ref 98–111)
Creatinine, Ser: 3.06 mg/dL — ABNORMAL HIGH (ref 0.44–1.00)
GFR calc non Af Amer: 16 mL/min — ABNORMAL LOW (ref >60)
GFR, EST AFRICAN AMERICAN: 19 mL/min — AB (ref >60)
Glucose, Bld: 87 mg/dL (ref 70–99)
Potassium: 4 mmol/L (ref 3.5–5.1)
Sodium: 144 mmol/L (ref 135–145)

## 2019-01-01 LAB — URINE CULTURE: Culture: NO GROWTH

## 2019-01-01 LAB — HIV ANTIBODY (ROUTINE TESTING W REFLEX): HIV Screen 4th Generation wRfx: NONREACTIVE

## 2019-01-01 LAB — GLUCOSE, CAPILLARY
GLUCOSE-CAPILLARY: 93 mg/dL (ref 70–99)
Glucose-Capillary: 76 mg/dL (ref 70–99)
Glucose-Capillary: 89 mg/dL (ref 70–99)
Glucose-Capillary: 92 mg/dL (ref 70–99)
Glucose-Capillary: 116 mg/dL — ABNORMAL HIGH (ref 70–99)
Glucose-Capillary: 126 mg/dL — ABNORMAL HIGH (ref 70–99)

## 2019-01-01 LAB — POCT I-STAT 7, (LYTES, BLD GAS, ICA,H+H)
Acid-base deficit: 3 mmol/L — ABNORMAL HIGH (ref 0.0–2.0)
Bicarbonate: 19.7 mmol/L — ABNORMAL LOW (ref 20.0–28.0)
Calcium, Ion: 1.16 mmol/L (ref 1.15–1.40)
HCT: 32 % — ABNORMAL LOW (ref 36.0–46.0)
Hemoglobin: 10.9 g/dL — ABNORMAL LOW (ref 12.0–15.0)
O2 SAT: 96 %
Potassium: 3.8 mmol/L (ref 3.5–5.1)
Sodium: 142 mmol/L (ref 135–145)
TCO2: 21 mmol/L — AB (ref 22–32)
pCO2 arterial: 28.3 mmHg — ABNORMAL LOW (ref 32.0–48.0)
pH, Arterial: 7.451 — ABNORMAL HIGH (ref 7.350–7.450)
pO2, Arterial: 73 mmHg — ABNORMAL LOW (ref 83.0–108.0)

## 2019-01-01 LAB — RESPIRATORY PANEL BY PCR
Adenovirus: NOT DETECTED
Bordetella pertussis: NOT DETECTED
Chlamydophila pneumoniae: NOT DETECTED
Coronavirus 229E: NOT DETECTED
Coronavirus HKU1: NOT DETECTED
Coronavirus NL63: NOT DETECTED
Coronavirus OC43: NOT DETECTED
INFLUENZA A-RVPPCR: NOT DETECTED
Influenza B: NOT DETECTED
Metapneumovirus: NOT DETECTED
Mycoplasma pneumoniae: NOT DETECTED
PARAINFLUENZA VIRUS 4-RVPPCR: NOT DETECTED
Parainfluenza Virus 1: NOT DETECTED
Parainfluenza Virus 2: NOT DETECTED
Parainfluenza Virus 3: NOT DETECTED
Respiratory Syncytial Virus: NOT DETECTED
Rhinovirus / Enterovirus: NOT DETECTED

## 2019-01-01 LAB — HEPATIC FUNCTION PANEL
ALK PHOS: 55 U/L (ref 38–126)
ALT: 289 U/L — ABNORMAL HIGH (ref 0–44)
AST: 355 U/L — ABNORMAL HIGH (ref 15–41)
Albumin: 3.4 g/dL — ABNORMAL LOW (ref 3.5–5.0)
Bilirubin, Direct: 0.2 mg/dL (ref 0.0–0.2)
Indirect Bilirubin: 0.9 mg/dL (ref 0.3–0.9)
Total Bilirubin: 1.1 mg/dL (ref 0.3–1.2)
Total Protein: 5.8 g/dL — ABNORMAL LOW (ref 6.5–8.1)

## 2019-01-01 LAB — PHOSPHORUS
Phosphorus: 2.7 mg/dL (ref 2.5–4.6)
Phosphorus: 2.4 mg/dL — ABNORMAL LOW (ref 2.5–4.6)
Phosphorus: 2.3 mg/dL — ABNORMAL LOW (ref 2.5–4.6)

## 2019-01-01 LAB — CBC
HCT: 36.3 % (ref 36.0–46.0)
Hemoglobin: 11.8 g/dL — ABNORMAL LOW (ref 12.0–15.0)
MCH: 28.2 pg (ref 26.0–34.0)
MCHC: 32.5 g/dL (ref 30.0–36.0)
MCV: 86.8 fL (ref 80.0–100.0)
Platelets: 230 10*3/uL (ref 150–400)
RBC: 4.18 MIL/uL (ref 3.87–5.11)
RDW: 14 % (ref 11.5–15.5)
WBC: 15.4 10*3/uL — ABNORMAL HIGH (ref 4.0–10.5)
nRBC: 0 % (ref 0.0–0.2)

## 2019-01-01 LAB — MRSA PCR SCREENING: MRSA BY PCR: POSITIVE — AB

## 2019-01-01 LAB — VANCOMYCIN, RANDOM: Vancomycin Rm: 15

## 2019-01-01 LAB — ECHOCARDIOGRAM COMPLETE
Height: 66 in
WEIGHTICAEL: 3019.42 [oz_av]

## 2019-01-01 MED ORDER — PRO-STAT SUGAR FREE PO LIQD
30.0000 mL | Freq: Every day | ORAL | Status: DC
  Administered 2019-01-01 – 2019-01-08 (×8): 30 mL
  Filled 2019-01-01 (×8): qty 30

## 2019-01-01 MED ORDER — VITAL AF 1.2 CAL PO LIQD
1000.0000 mL | ORAL | Status: DC
  Administered 2019-01-01 – 2019-01-08 (×9): 1000 mL

## 2019-01-01 MED ORDER — VANCOMYCIN HCL IN DEXTROSE 1-5 GM/200ML-% IV SOLN
1000.0000 mg | INTRAVENOUS | Status: DC
  Administered 2019-01-01: 1000 mg via INTRAVENOUS
  Filled 2019-01-01: qty 200

## 2019-01-01 NOTE — Progress Notes (Signed)
Initial Nutrition Assessment  DOCUMENTATION CODES:   Not applicable  INTERVENTION:   Tube feeding: Vital AF 1.2 @ 60 ml/hr (1440 ml) via NGT 30 ml Prostat once daily   Provides: 1828 kcals, 123 grams protein, 1168 ml free water. Meets 100% of needs.   NUTRITION DIAGNOSIS:   Inadequate oral intake related to inability to eat as evidenced by NPO status.  GOAL:   Provide needs based on ASPEN/SCCM guidelines  MONITOR:   Diet advancement, Vent status, Skin, TF tolerance, Weight trends, Labs, I & O's  REASON FOR ASSESSMENT:   Ventilator    ASSESSMENT:   Patient with PMH significant for bipolar disorder, chronic opiate use, HTN, and allergies. Found unresponsive and CPR started. Admitted with shock, concern for sepsis, and ARF from ATN.    Per RN, pt's UDS positive for opiates and cocaine, concern for OD. Per CCM, okay to start nutrition.   Spoke with husband at bedside. States they have been separated for over a year but husband seems to think her appetite was normal PTA (three meals with snacks daily). Denies pt had any unintentional wt loss PTA. UBW reported as 175 lb.   Will utilize EDW of 85.6 kg to estimated needs.   Patient is currently intubated on ventilator support MV: 12.3 L/min Temp (24hrs), Avg:99.4 F (37.4 C), Min:97.7 F (36.5 C), Max:101 F (38.3 C)  Propofol: discontinued   I/O: +362 ml since admit UOP: 495 ml x 24 hrs  NGT: 600 ml x 24 hrs   Medications reviewed and include: SS novolog, levophed Labs reviewed: elevated LFTs  NUTRITION - FOCUSED PHYSICAL EXAM:    Most Recent Value  Orbital Region  No depletion  Upper Arm Region  No depletion  Thoracic and Lumbar Region  Unable to assess  Buccal Region  Unable to assess  Temple Region  No depletion  Clavicle Bone Region  No depletion  Clavicle and Acromion Bone Region  No depletion  Scapular Bone Region  Unable to assess  Dorsal Hand  No depletion  Patellar Region  No depletion  Anterior  Thigh Region  No depletion  Posterior Calf Region  No depletion  Edema (RD Assessment)  None  Hair  Reviewed  Eyes  Unable to assess  Mouth  Unable to assess  Skin  Reviewed  Nails  Reviewed     Diet Order:   Diet Order            Diet NPO time specified  Diet effective now              EDUCATION NEEDS:   Not appropriate for education at this time  Skin:  Skin Assessment: Reviewed RN Assessment  Last BM:  PTA  Height:   Ht Readings from Last 1 Encounters:  January 16, 2019 5\' 6"  (1.676 m)    Weight:   Wt Readings from Last 1 Encounters:  2019-01-16 85.6 kg    Ideal Body Weight:  59.1 kg  BMI:  Body mass index is 30.46 kg/m.  Estimated Nutritional Needs:   Kcal:  1819 kcal  Protein:  120-135 grams  Fluid:  >/= 1.8 L/day   Vanessa Kick RD, LDN Clinical Nutrition Pager # - [phone]

## 2019-01-01 NOTE — Procedures (Signed)
ELECTROENCEPHALOGRAM REPORT   Patient: Rita Santos       Room #: 3M11C EEG No. ID: [id number] Age: 58 y.o.        Sex: female Referring Physician: Molli Knock Report Date:  01/01/2019        Interpreting Physician: Thana Farr  History: Jaelynne Pollins is an 58 y.o. female with altered mental status  Medications:  Levophed, Propofol, Insulin, Vancomycin, Protonix, Zosyn  Conditions of Recording:  This is a 21 channel routine scalp EEG performed with bipolar and monopolar montages arranged in accordance to the international 10/20 system of electrode placement. One channel was dedicated to EKG recording.  The patient is in the intubated and sedated state.  Description:  The background activity is slow and poorly organized.  It consists of low to moderate voltage polymorphic delta activity with intermixed theta activity.  This activity is continuous and diffusely distributed.  There is no improvement in the background rhythm with stimulation.   No epileptiform activity is noted.   Hyperventilation and intermittent photic stimulation were not performed.   IMPRESSION: This is an abnormal EEG secondary to general background slowing.  This finding may be seen with a diffuse disturbance that is etiologically nonspecific, but may include a metabolic encephalopathy or medication effect, among other possibilities.  No epileptiform activity was noted.     Thana Farr, MD Neurology [phone] 01/01/2019, 12:55 PM

## 2019-01-01 NOTE — Progress Notes (Signed)
EEG complete - results pending 

## 2019-01-01 NOTE — Progress Notes (Signed)
  Echocardiogram 2D Echocardiogram has been performed.  Rita Santos 01/01/2019, 12:21 PM

## 2019-01-01 NOTE — Progress Notes (Signed)
Pharmacy Antibiotic Note  Rita Santos is a 58 y.o. female admitted on 01/06/2019 with sepsis and aspiration PNA.   Was found unresponsive now s/p CPR with unknown downtime. SCr has improved with hydration SCr 4.2 > 3. A vancomycin random level obtained this afternoon, returned at 15, low end of therapeutic range.  Plan: Vancomycin 1g/24h Order zosyn 3.375 g IV every 12 hours Monitor renal fx, cx results, clinical pic, and for s/sx of bleeding  Height: 5\' 6"  (167.6 cm) Weight: 188 lb 11.4 oz (85.6 kg) IBW/kg (Calculated) : 59.3  Temp (24hrs), Avg:99.6 F (37.6 C), Min:98.5 F (36.9 C), Max:101 F (38.3 C)  Recent Labs  Lab 01/06/2019 1434 01/06/2019 1435 01/06/2019 1724 01-06-2019 1925 01/01/19 0308 01/01/19 0640 01/01/19 1556  WBC 22.0*  --   --   --   --  15.4*  --   CREATININE 4.23*  --   --   --  3.06*  --   --   LATICACIDVEN  --  8.4* 4.2* 3.1*  --   --   --   VANCORANDOM  --   --   --   --   --   --  15     Antimicrobials this admission: Zosyn 3/8 >>  Vancomycin 3/8 >>   Dose adjustments this admission: N/A  Microbiology results: 3/8 BCx: sent 3/8 UCx: sent  3/8 Sputum: sent  3/8 Resp PCR: sent    Baldemar Friday 01/01/2019 6:19 PM

## 2019-01-01 NOTE — Progress Notes (Signed)
PULMONARY / CRITICAL CARE MEDICINE   NAME:  Rita Santos, MRN:  [id number], DOB:  [birth year], LOS: 1 ADMISSION DATE:  01/04/2019, CONSULTATION DATE:  01/09/2019 REFERRING MD:  Dr. 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  Received bystander CPR for about 10 minutes and then 2 minutes by fire department before ROSC.  Received narcan in field w/o response.  Intubated in ER.    HISTORY OF PRESENT ILLNESS:  History from ER staff and patient's coworker.  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  Received bystander CPR for about 10 minutes and then 2 minutes by fire department before ROSC.  Received narcan in field w/o response.  Intubated in ER.    She lives with her elderly parents in [place].  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  No recent exposures to anyone with respiratory infection.  Patient reported that she ate something funny on 3/07 and wasn't feeling well.  Instead of going home she went to sleep at home of the family she cares for.  She apparently told them that she felt like she would wake up after going to sleep.  She went to bed around 9 pm on 3/07.  In am of 3/08 she was snoring, and wouldn't wake up.  The family called another caregiver who arrived around 1230 pm.  She immediately recognized something was wrong, had them call 911 and started CPR.  She did this for about 10 minutes and then fire department did another 2 minutes of CPR.  No shock needed per AED.  She remained hypotensive and not able to protect airway.  Intubated in ER and started on epinephrine.  Police are trying to locate her family.  SIGNIFICANT PAST MEDICAL HISTORY   Bipolar, Chronic opiate medication use, HTN, Allergies,  Insomnia  SIGNIFICANT EVENTS:  3/08 Admit  STUDIES:   CT head 3/08 >> small vessel white matter disease  CULTURES:  Blood 3/08 >> Sputum 3/08 >> Influenza PCR 3/08 >> Respiratory viral panel 3/08 >> Pneumococcal Ag 3/08 >>  ANTIBIOTICS:  Vancomycin 3/08 >> Zosyn 3/08 >>   LINES/TUBES:  ETT 3/08 >>   CONSULTANTS:    SUBJECTIVE:  No events overnight, no new complaints  CONSTITUTIONAL: BP 103/61 (BP Location: Right Arm)   Pulse 69   Temp 100.1 F (37.8 C) (Oral)   Resp (!) 24   Ht 5\' 6"  (1.676 m)   Wt 85.6 kg   SpO2 100%   BMI 30.46 kg/m   I/O last 3 completed shifts: In: 1356.9 [I.V.:1059.4; IV Piggyback:297.5] Out: 1095 [Urine:495; Emesis/NG output:600]     Vent Mode: PRVC FiO2 (%):  [40 %-100 %] 40 % Set Rate:  [21 bmp-24 bmp] 24 bmp Vt Set:  [510 mL] 510 mL PEEP:  [5 cmH20] 5 cmH20 Plateau Pressure:  [16 cmH20-23 cmH20] 17 cmH20  PHYSICAL EXAM:  General - Acutely ill appearing female, NAD on vent Eyes - PERRL, EOM-I ENT - ETT in place Cardiac - RRR, Nl S1/S2 and -M/R/G Chest - Coarse BS diffusely Abdomen - Soft, NT, ND and +BS Extremities - -edema and -tenderness Skin - no rashes Neuro - not  following commands GU - no lesions noted  CXR (reviewed by me) - Lt patchy ASD    RESOLVED PROBLEM LIST   ASSESSMENT AND PLAN    Acute respiratory failure with hypoxia and compromised airway. Plan - Full vent support - Decrease RR to 20 - F/u CXR, ABG in AM  Shock with concern for sepsis and aspiration pneumonia. Plan - Continue ABx as ordered - Pressors to keep MAP > 65 - Continue IV fluids - Levpohed for BP support - F/u culture results - Check Echo, cortisol (41.9), procalcitonin (3.73), TSH - Tele monitoring  Acute metabolic encephalopathy. Hx of Bipolar, Chronic opiate use. Plan - RASS goal 0 to -1 - F/u EEG - Might need neurology assessment pending EEG results - Hold outpt abilify, norco, trazodone  Acute renal failure from  ATN >> baseline creatinine 1.08 from 09/20/17. Plan - Insert foley - Continue IV fluids - BMET in AM - Replace electrolytes as indicated  Elevated LFTs from hypoxia, shock. Plan - F/U LFTs  Best Practice / Goals of Care / Disposition.   DVT PROPHYLAXIS: SCDs SUP: Protonix NUTRITION: NPO MOBILITY: Bed rest GOALS OF CARE: full code FAMILY DISCUSSIONS: no family at bedside DISPOSITION: ICU  LABS  Glucose Recent Labs  Lab 01-08-19 1935 January 08, 2019 2352 01/01/19 0433 01/01/19 0802 01/01/19 1131  GLUCAP 105* 79 76 89 92    BMET Recent Labs  Lab Jan 08, 2019 1434 01/01/19 0308 01/01/19 0437  NA 145 144 142  K 4.6 4.0 3.8  CL 103 112*  --   CO2 21* 21*  --   BUN 31* 40*  --   CREATININE 4.23* 3.06*  --   GLUCOSE 132* 87  --     Liver Enzymes Recent Labs  Lab 01/08/19 1434 01/01/19 0308  AST 146* 355*  ALT 115* 289*  ALKPHOS 75 55  BILITOT 0.6 1.1  ALBUMIN 4.2 3.4*    Electrolytes Recent Labs  Lab Jan 08, 2019 1434 01/01/19 0308  CALCIUM 8.5* 8.2*  MG 2.6* 1.8  PHOS  --  2.7    CBC Recent Labs  Lab 08-Jan-2019 1434 01/01/19 0437 01/01/19 0640  WBC 22.0*  --  15.4*  HGB 14.0 10.9* 11.8*  HCT 47.6* 32.0* 36.3  PLT 312  --  230    ABG Recent Labs  Lab 01/08/2019 1842 01/01/19 0437  PHART 7.284* 7.451*  PCO2ART 47.2 28.3*  PO2ART 245* 73.0*    Coag's Recent Labs  Lab 01/08/2019 1434 01-08-2019 1724  APTT  --  22*  INR 1.1 1.1    Sepsis Markers Recent Labs  Lab 01-08-19 1435 2019/01/08 1724 08-Jan-2019 1925  LATICACIDVEN 8.4* 4.2* 3.1*  PROCALCITON  --  3.73  --     Cardiac Enzymes Recent Labs  Lab Jan 08, 2019 1434  TROPONINI 0.08*    PAST MEDICAL HISTORY :   She  has a past medical history of Allergy, Anxiety, and Hypertension.  PAST SURGICAL HISTORY:  She  has a past surgical history that includes Abdominal hysterectomy (2005) and Cesarean section (1989).  No Known Allergies  No current facility-administered medications on file  prior to encounter.    Current Outpatient Medications on File Prior to Encounter  Medication Sig  . ARIPiprazole (ABILIFY) 15 MG tablet Take 15 mg by mouth daily.   . cetirizine (ZYRTEC) 10 MG tablet Take 10 mg by mouth daily.  Marland Kitchen HYDROcodone-acetaminophen (NORCO) 7.5-325 MG per tablet Take 2 tablets by mouth daily.  . metoprolol tartrate (LOPRESSOR) 25 MG tablet Take 1  tablet (25 mg total) by mouth 2 (two) times daily.  . Multiple Vitamin (MULTIVITAMIN) capsule Take 1 capsule by mouth daily.   . traZODone (DESYREL) 50 MG tablet Take 50 mg by mouth at bedtime as needed for sleep.   The patient is critically ill with multiple organ systems failure and requires high complexity decision making for assessment and support, frequent evaluation and titration of therapies, application of advanced monitoring technologies and extensive interpretation of multiple databases.   Critical Care Time devoted to patient care services described in this note is  36  Minutes. This time reflects time of care of this signee Dr Koren BoundWesam Kalianne Fetting. This critical care time does not reflect procedure time, or teaching time or supervisory time of PA/NP/Med student/Med Resident etc but could involve care discussion time.  Alyson ReedyWesam G. Selenne Coggin, M.D. [facility]/Critical [facility] Medicine. Pager: [phone]. After hours pager: [phone].

## 2019-01-02 ENCOUNTER — Inpatient Hospital Stay (HOSPITAL_COMMUNITY): Payer: BLUE CROSS/BLUE SHIELD

## 2019-01-02 DIAGNOSIS — I10 Essential (primary) hypertension: Secondary | ICD-10-CM

## 2019-01-02 DIAGNOSIS — I469 Cardiac arrest, cause unspecified: Secondary | ICD-10-CM

## 2019-01-02 DIAGNOSIS — G931 Anoxic brain damage, not elsewhere classified: Secondary | ICD-10-CM

## 2019-01-02 LAB — POCT I-STAT 7, (LYTES, BLD GAS, ICA,H+H)
BICARBONATE: 24.5 mmol/L (ref 20.0–28.0)
Calcium, Ion: 1.29 mmol/L (ref 1.15–1.40)
HCT: 30 % — ABNORMAL LOW (ref 36.0–46.0)
Hemoglobin: 10.2 g/dL — ABNORMAL LOW (ref 12.0–15.0)
O2 SAT: 99 %
Potassium: 3.7 mmol/L (ref 3.5–5.1)
Sodium: 147 mmol/L — ABNORMAL HIGH (ref 135–145)
TCO2: 26 mmol/L (ref 22–32)
pCO2 arterial: 40.2 mmHg (ref 32.0–48.0)
pH, Arterial: 7.392 (ref 7.350–7.450)
pO2, Arterial: 122 mmHg — ABNORMAL HIGH (ref 83.0–108.0)

## 2019-01-02 LAB — GLUCOSE, CAPILLARY
Glucose-Capillary: 123 mg/dL — ABNORMAL HIGH (ref 70–99)
Glucose-Capillary: 125 mg/dL — ABNORMAL HIGH (ref 70–99)
Glucose-Capillary: 129 mg/dL — ABNORMAL HIGH (ref 70–99)
Glucose-Capillary: 132 mg/dL — ABNORMAL HIGH (ref 70–99)
Glucose-Capillary: 132 mg/dL — ABNORMAL HIGH (ref 70–99)
Glucose-Capillary: 136 mg/dL — ABNORMAL HIGH (ref 70–99)

## 2019-01-02 LAB — HEPATIC FUNCTION PANEL
ALT: 208 U/L — ABNORMAL HIGH (ref 0–44)
AST: 145 U/L — AB (ref 15–41)
Albumin: 2.8 g/dL — ABNORMAL LOW (ref 3.5–5.0)
Alkaline Phosphatase: 55 U/L (ref 38–126)
BILIRUBIN DIRECT: 0.2 mg/dL (ref 0.0–0.2)
Indirect Bilirubin: 0.5 mg/dL (ref 0.3–0.9)
TOTAL PROTEIN: 5.4 g/dL — AB (ref 6.5–8.1)
Total Bilirubin: 0.7 mg/dL (ref 0.3–1.2)

## 2019-01-02 LAB — CBC
HCT: 33.5 % — ABNORMAL LOW (ref 36.0–46.0)
HEMOGLOBIN: 11.1 g/dL — AB (ref 12.0–15.0)
MCH: 29.1 pg (ref 26.0–34.0)
MCHC: 33.1 g/dL (ref 30.0–36.0)
MCV: 87.7 fL (ref 80.0–100.0)
Platelets: 178 10*3/uL (ref 150–400)
RBC: 3.82 MIL/uL — ABNORMAL LOW (ref 3.87–5.11)
RDW: 14.6 % (ref 11.5–15.5)
WBC: 10.9 10*3/uL — ABNORMAL HIGH (ref 4.0–10.5)
nRBC: 0 % (ref 0.0–0.2)

## 2019-01-02 LAB — MAGNESIUM
MAGNESIUM: 2.1 mg/dL (ref 1.7–2.4)
Magnesium: 1.9 mg/dL (ref 1.7–2.4)

## 2019-01-02 LAB — BASIC METABOLIC PANEL
Anion gap: 6 (ref 5–15)
BUN: 26 mg/dL — ABNORMAL HIGH (ref 6–20)
CO2: 22 mmol/L (ref 22–32)
Calcium: 8.7 mg/dL — ABNORMAL LOW (ref 8.9–10.3)
Chloride: 116 mmol/L — ABNORMAL HIGH (ref 98–111)
Creatinine, Ser: 1.55 mg/dL — ABNORMAL HIGH (ref 0.44–1.00)
GFR calc Af Amer: 43 mL/min — ABNORMAL LOW (ref >60)
GFR calc non Af Amer: 37 mL/min — ABNORMAL LOW (ref >60)
Glucose, Bld: 127 mg/dL — ABNORMAL HIGH (ref 70–99)
Potassium: 3.7 mmol/L (ref 3.5–5.1)
Sodium: 144 mmol/L (ref 135–145)

## 2019-01-02 LAB — PHOSPHORUS
PHOSPHORUS: 1.6 mg/dL — AB (ref 2.5–4.6)
Phosphorus: 2.7 mg/dL (ref 2.5–4.6)

## 2019-01-02 MED ORDER — MUPIROCIN 2 % EX OINT
1.0000 "application " | TOPICAL_OINTMENT | Freq: Two times a day (BID) | CUTANEOUS | Status: AC
  Administered 2019-01-02 – 2019-01-06 (×10): 1 via NASAL
  Filled 2019-01-02: qty 22

## 2019-01-02 MED ORDER — HEPARIN SODIUM (PORCINE) 5000 UNIT/ML IJ SOLN: 5000.0000 [IU] | Freq: Three times a day (TID) | INTRAMUSCULAR | Status: DC

## 2019-01-02 MED ORDER — HEPARIN SODIUM (PORCINE) 5000 UNIT/ML IJ SOLN
5000.0000 [IU] | Freq: Three times a day (TID) | INTRAMUSCULAR | Status: DC
  Administered 2019-01-02 – 2019-01-15 (×38): 5000 [IU] via SUBCUTANEOUS
  Filled 2019-01-02 (×36): qty 1

## 2019-01-02 MED ORDER — POTASSIUM & SODIUM PHOSPHATES 280-160-250 MG PO PACK
1.0000 | PACK | Freq: Two times a day (BID) | ORAL | Status: DC
  Filled 2019-01-02: qty 1

## 2019-01-02 MED ORDER — PIPERACILLIN-TAZOBACTAM 3.375 G IVPB
3.3750 g | Freq: Three times a day (TID) | INTRAVENOUS | Status: DC
  Administered 2019-01-02 – 2019-01-03 (×3): 3.375 g via INTRAVENOUS
  Filled 2019-01-02 (×4): qty 50

## 2019-01-02 MED ORDER — VANCOMYCIN HCL IN DEXTROSE 750-5 MG/150ML-% IV SOLN
750.0000 mg | INTRAVENOUS | Status: DC
  Administered 2019-01-02 – 2019-01-03 (×2): 750 mg via INTRAVENOUS
  Filled 2019-01-02 (×3): qty 150

## 2019-01-02 MED ORDER — POTASSIUM PHOSPHATES 15 MMOLE/5ML IV SOLN
20.0000 mmol | Freq: Once | INTRAVENOUS | Status: AC
  Administered 2019-01-02: 20 mmol via INTRAVENOUS
  Filled 2019-01-02: qty 6.67

## 2019-01-02 MED ORDER — PANTOPRAZOLE SODIUM 40 MG PO PACK
40.0000 mg | PACK | Freq: Every day | ORAL | Status: DC
  Administered 2019-01-02 – 2019-01-14 (×13): 40 mg
  Filled 2019-01-02 (×13): qty 20

## 2019-01-02 NOTE — Progress Notes (Signed)
Pharmacy Antibiotic Note  Ovell Adkinson is a 58 y.o. female admitted on 01-06-2019 with sepsis and aspiration PNA.   Was found unresponsive now s/p CPR with unknown downtime.  Random level last night 15 and pt received 1g dose. SCr has further improved today with a SCR of 1.55 and estimated CrCl ~11ml/min. WBC trending down, afebrile overnight.  Plan: Vancomycin 750mg  IV Q24hrs Calculated AUC 490.6 Cmax = 30.5 Cmin = 13.4 SCR used = 1.55 Change zosyn 3.375 g IV to every 8 hours due to improved renal function Monitor renal fx, cx results, clinical pic, and for s/sx of bleeding  Height: 5\' 6"  (167.6 cm) Weight: 187 lb 13.3 oz (85.2 kg) IBW/kg (Calculated) : 59.3  Temp (24hrs), Avg:99.3 F (37.4 C), Min:98.8 F (37.1 C), Max:100.1 F (37.8 C)  Recent Labs  Lab 06-Jan-2019 1434 01-06-2019 1435 01-06-19 1724 January 06, 2019 1925 01/01/19 0308 01/01/19 0640 01/01/19 1556 01/02/19 0355  WBC 22.0*  --   --   --   --  15.4*  --  10.9*  CREATININE 4.23*  --   --   --  3.06*  --   --  1.55*  LATICACIDVEN  --  8.4* 4.2* 3.1*  --   --   --   --   VANCORANDOM  --   --   --   --   --   --  15  --      Antimicrobials this admission: Zosyn 3/8 >>  Vancomycin 3/8 >>   Dose adjustments this admission: Zosyn 3.375mg  IV Q12hrs >>zosyn 3.375mg  IV Q8hrs Vanc 1g Q24hrs>> vanc 750mg  IV Q24hrs  Microbiology results: 3/8 BCx: sent 3/8 UCx: sent  3/8 Sputum: GPC 3/8 Resp PCR: negative 3/8 MRSA PCR: positive   Lenward Chancellor, PharmD PGY1 Pharmacy Resident 01/02/2019 8:39 AM

## 2019-01-02 NOTE — Progress Notes (Addendum)
PULMONARY / CRITICAL CARE MEDICINE   NAME:  Rita Santos, MRN:  [id number], DOB:  1961/03/10, LOS: 2 ADMISSION DATE:  01/04/2019, CONSULTATION DATE:  01/05/2019 REFERRING MD:  Dr. 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  Received bystander CPR for about 10 minutes and then 2 minutes by fire department before ROSC.  Received narcan in field w/o response.  Intubated in ER.    HISTORY OF PRESENT ILLNESS:  History from ER staff and patient's coworker.  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  Received bystander CPR for about 10 minutes and then 2 minutes by fire department before ROSC.  Received narcan in field w/o response.  Intubated in ER.    She lives with her elderly parents in [place].  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  No recent exposures to anyone with respiratory infection.  Patient reported that she ate something funny on 3/07 and wasn't feeling well.  Instead of going home she went to sleep at home of the family she cares for.  She apparently told them that she felt like she would wake up after going to sleep.  She went to bed around 9 pm on 3/07.  In am of 3/08 she was snoring, and wouldn't wake up.  The family called another caregiver who arrived around 1230 pm.  She immediately recognized something was wrong, had them call 911 and started CPR.  She did this for about 10 minutes and then fire department did another 2 minutes of CPR.  No shock needed per AED.  She remained hypotensive and not able to protect airway.  Intubated in ER and started on epinephrine.  Police are trying to locate her family.  SIGNIFICANT PAST MEDICAL HISTORY   Bipolar, Chronic opiate medication use, HTN, Allergies,  Insomnia  SIGNIFICANT EVENTS:  3/08 Admit  STUDIES:   CT head 3/08 >> small vessel white matter disease  CULTURES:  Blood 3/08 >> Sputum 3/08 >> staph aureus>> Influenza PCR 3/08 >> Respiratory viral panel 3/08 >> Pneumococcal Ag 3/08 >> 12/2018+ MRSA screen nasopharynx ANTIBIOTICS:  Vancomycin 3/08 >> Zosyn 3/08 >>   LINES/TUBES:  ETT 3/08 >>   CONSULTANTS:    SUBJECTIVE:  Follow sedation still not awake  CONSTITUTIONAL: BP 131/76   Pulse 82   Temp 99.5 F (37.5 C) (Oral)   Resp 20   Ht 5\' 6"  (1.676 m)   Wt 85.2 kg   SpO2 100%   BMI 30.32 kg/m   I/O last 3 completed shifts: In: 4153.8 [I.V.:3016.3; NG/GT:840; IV Piggyback:297.5] Out: 1805 [Urine:1205; Emesis/NG output:600]     Vent Mode: PRVC FiO2 (%):  [40 %] 40 % Set Rate:  [20 bmp-24 bmp] 20 bmp Vt Set:  [470 mL-510 mL] 470 mL PEEP:  [5 cmH20] 5 cmH20 Plateau Pressure:  [15 cmH20-48 cmH20] 48 cmH20  PHYSICAL EXAM:  General: Appears much older than stated age. HEENT: JVD lymphadenopathy is appreciated Neuro: Does not follow verbal commands.  Grimaces to noxious stimuli CV: Heart sounds are regular PULM: Creased breath sounds in the bases DU:PBDH, non-tender, bsx4 active  Extremities: warm/dry, negative edema  Skin: no rashes or  lesions   01/02/2019 chest x-ray reviewed no significant change    RESOLVED PROBLEM LIST   ASSESSMENT AND PLAN    Acute respiratory failure with hypoxia and compromised airway. Plan - Wean FiO2 as tolerated - Full vent support  Shock with concern for sepsis and aspiration pneumonia. Plan -Currently off pressors -Continue antimicrobial therapy -Continue IV fluids -Follow-up with echo with echo 60 to 65%.  Impaired relaxation phase.  Acute metabolic encephalopathy. Hx of Bipolar, Chronic opiate use. Plan -Currently off all sedation -EEG is abnormal -We will consult neurology 01/02/2019 possible MRI -Hold outpatient Abilify narcotics and trazodone  Acute  renal failure from ATN >> baseline creatinine 1.08 from 09/20/17. Lab Results  Component Value Date   CREATININE 1.55 (H) 01/02/2019   CREATININE 3.06 (H) 01/01/2019   CREATININE 4.23 (H) January 19, 2019   Recent Labs  Lab 01/01/19 0437 01/02/19 0328 01/02/19 0355  K 3.8 3.7 3.7    Plan -Fluid hydration -Monitor renal function  -strict INO  Elevated LFTs from hypoxia, shock. Plan -Monitor LFTs reordered for 01/02/2019  Best Practice / Goals of Care / Disposition.   DVT PROPHYLAXIS: SCDs SUP: Protonix NUTRITION: NPO MOBILITY: Bed rest GOALS OF CARE: full code FAMILY DISCUSSIONS: 01/02/2019 no family bedside DISPOSITION: ICU  LABS  Glucose Recent Labs  Lab 01/01/19 1131 01/01/19 1544 01/01/19 1958 01/01/19 2300 01/02/19 0352 01/02/19 0814  GLUCAP 92 93 116* 126* 125* 132*    BMET Recent Labs  Lab 19-Jan-2019 1434 01/01/19 0308 01/01/19 0437 01/02/19 0328 01/02/19 0355  NA 145 144 142 147* 144  K 4.6 4.0 3.8 3.7 3.7  CL 103 112*  --   --  116*  CO2 21* 21*  --   --  22  BUN 31* 40*  --   --  26*  CREATININE 4.23* 3.06*  --   --  1.55*  GLUCOSE 132* 87  --   --  127*    Liver Enzymes Recent Labs  Lab 01-19-19 1434 01/01/19 0308  AST 146* 355*  ALT 115* 289*  ALKPHOS 75 55  BILITOT 0.6 1.1  ALBUMIN 4.2 3.4*    Electrolytes Recent Labs  Lab 01/19/2019 1434  01/01/19 0308 01/01/19 1305 01/01/19 1556 01/02/19 0355  CALCIUM 8.5*  --  8.2*  --   --  8.7*  MG 2.6*  --  1.8 1.9 2.0 2.1  PHOS  --    < > 2.7 2.4* 2.3* 1.6*   < > = values in this interval not displayed.    CBC Recent Labs  Lab 01-19-19 1434  01/01/19 0640 01/02/19 0328 01/02/19 0355  WBC 22.0*  --  15.4*  --  10.9*  HGB 14.0   < > 11.8* 10.2* 11.1*  HCT 47.6*   < > 36.3 30.0* 33.5*  PLT 312  --  230  --  178   < > = values in this interval not displayed.    ABG Recent Labs  Lab 2019/01/19 1842 01/01/19 0437 01/02/19 0328  PHART 7.284* 7.451* 7.392  PCO2ART 47.2 28.3*  40.2  PO2ART 245* 73.0* 122.0*    Coag's Recent Labs  Lab 01-19-19 1434 01-19-2019 1724  APTT  --  22*  INR 1.1 1.1    Sepsis Markers Recent Labs  Lab 01/19/2019 1435 19-Jan-2019 1724 Jan 19, 2019 1925  LATICACIDVEN 8.4* 4.2* 3.1*  PROCALCITON  --  3.73  --     Cardiac Enzymes Recent Labs  Lab January 19, 2019 1434  TROPONINI 0.08*   App ctt  35 min  Brett Canales Minor ACNP Adolph Pollack PCCM Pager [phone] till 1 pm If no answer page [phone]  Attending Note:  58 year old female with polysubstance abuse who presents to the hospital after an episode of cardiac arrest and now suspicious of anoxic brain injury.  On exam, remains completely unresponsive without evidence of seizure with clear lungs.  I reviewed CXR, ETT is in a good position.  Discussed with PCCM-NP.  Will continue full support for now.  Order MRI.  EEG noted.  Neurology consult called.  Hold extubation for now.  PS trials as tolerated.  PCCM will continue to manage.  The patient is critically ill with multiple organ systems failure and requires high complexity decision making for assessment and support, frequent evaluation and titration of therapies, application of advanced monitoring technologies and extensive interpretation of multiple databases.   Critical Care Time devoted to patient care services described in this note is  32  Minutes. This time reflects time of care of this signee Dr Koren Bound. This critical care time does not reflect procedure time, or teaching time or supervisory time of PA/NP/Med student/Med Resident etc but could involve care discussion time.  Alyson Reedy, M.D. [facility] Pulmonary/Critical Care Medicine. Pager: [phone]. After hours pager: [phone].  01/02/2019, 11:10 AM

## 2019-01-02 NOTE — Consult Note (Addendum)
NEURO HOSPITALIST CONSULT NOTE   Requestig physician: Dr. Molli Knock  Reason for Consult: Encephalopathy  History obtained from:  Chart and RN  HPI:                                                                                                                                          Rita Santos is an 58 y.o. female with bipolar disorder who presented to the [facility] via EMS for cardiac arrest. Patient last seen normal by family at ~10pm yesterday, next seen at [facility] next day when she was found unresponsive. CPR initiated by family and continued by EMS. On arrival to the ED, she was intubated. Initial labs revealed a leukocytosis to 22, AKI with BUN 31/Cr 4.2, transaminitis (AST 146/ALT 115), anion gap metabolic acidosis likely due to AKI and lactic acidosis (8.4 on arrival). TSH was unremarkable; random cortisol wnl. UDS positive for cocaine and opiates. CT head on arrival was negative for hemorrhage. Course complicated by likely aspiration pneumonia and shock (now resolved and off of pressors since yesterday).   Per RN report, patient was responsive to voice yesterday, however this has continued to decline; currently she will withdraw to pain only. RN also reported abnormal movement of LUE (flexed at shoulder and elbow with tremors) that resolved last night. She has been off of sedation (propofol) since 8 AM yesterday and has not received any sedating meds since then.   Past Medical History:  Diagnosis Date  . Allergy   . Anxiety   . Hypertension     Past Surgical History:  Procedure Laterality Date  . ABDOMINAL HYSTERECTOMY  2005  . CESAREAN SECTION  1989    Family History  Problem Relation Age of Onset  . Hypertension Mother   . Hypertension Sister   . Cancer Paternal Grandfather        lung    Social History:  reports that she has been smoking cigarettes. She has a 4.50 pack-year smoking history. She has never used smokeless tobacco. She reports current  alcohol use. She reports that she does not use drugs.  No Known Allergies  MEDICATIONS:  I have reviewed the patient's current medications.   ROS:                                                                                                                                       History obtained from unobtainable from patient due to mental status  Blood pressure 131/76, pulse 82, temperature 99.5 F (37.5 C), temperature source Oral, resp. rate 20, height 5\' 6"  (1.676 m), weight 85.2 kg, SpO2 100 %.  General Examination:                                                                                                      Physical Exam  HEENT-  Normocephalic, no lesions, without obvious abnormality.  Normal external eye and conjunctiva.   Cardiovascular- S1-S2 audible, pulses palpable throughout   Lungs- ETT in place; no rhonchi or wheezing noted, no excessive working breathing.  Abdomen- All 4 quadrants palpated and nontender Extremities- Warm, dry and intact Musculoskeletal-no joint deformity or swelling Skin-warm and dry, no hyperpigmentation, vitiligo, or suspicious lesions  Neurological Examination Mental Status: Not alert; will grind teeth with sternal rub and withdraw limbs weakly to pain. Cranial Nerves: II, III,IV, VI: bilaterally pupils equal, round, reactive to light, weak corneal reflex bilaterally, no blink to threat. Doll's eye reflexes intact. V,VII: facial symmetry present Motor: Flaccid paralysis throughout, decreased tone throughout. Sensory: Weak withdrawal of BLEs to plantar stimulation Deep Tendon Reflexes: 3+, brisk, and symmetric throughout; positive Hoffman's sign on the right. Plantars: Right: briskly upgoing   Left: briskly upgoing  Lab Results: Basic Metabolic Panel: Recent Labs  Lab 01/22/2019 1434 01/01/19 0308 01/01/19 0437  01/01/19 1305 01/01/19 1556 01/02/19 0328 01/02/19 0355  NA 145 144 142  --   --  147* 144  K 4.6 4.0 3.8  --   --  3.7 3.7  CL 103 112*  --   --   --   --  116*  CO2 21* 21*  --   --   --   --  22  GLUCOSE 132* 87  --   --   --   --  127*  BUN 31* 40*  --   --   --   --  26*  CREATININE 4.23* 3.06*  --   --   --   --  1.55*  CALCIUM 8.5* 8.2*  --   --   --   --  8.7*  MG 2.6* 1.8  --  1.9 2.0  --  2.1  PHOS  --  2.7  --  2.4* 2.3*  --  1.6*    CBC: Recent Labs  Lab 2019-01-18 1434 01/01/19 0437 01/01/19 0640 01/02/19 0328 01/02/19 0355  WBC 22.0*  --  15.4*  --  10.9*  NEUTROABS 16.9*  --   --   --   --   HGB 14.0 10.9* 11.8* 10.2* 11.1*  HCT 47.6* 32.0* 36.3 30.0* 33.5*  MCV 96.4  --  86.8  --  87.7  PLT 312  --  230  --  178    Cardiac Enzymes: Recent Labs  Lab 01/18/19 1434  TROPONINI 0.08*    Lipid Panel: Recent Labs  Lab 01/18/2019 1724  TRIG 109    Imaging: Ct Head Wo Contrast  Result Date: Jan 18, 2019 CLINICAL DATA:  Loss of consciousness, post arrest EXAM: CT HEAD WITHOUT CONTRAST TECHNIQUE: Contiguous axial images were obtained from the base of the skull through the vertex without intravenous contrast. COMPARISON:  None. FINDINGS: Brain: No evidence of acute infarction, hemorrhage, hydrocephalus, extra-axial collection or mass lesion/mass effect. Periventricular white matter hypodensity. Vascular: No hyperdense vessel or unexpected calcification. Skull: Normal. Negative for fracture or focal lesion. Sinuses/Orbits: No acute finding. Other: None. IMPRESSION: No acute intracranial pathology.  Small-vessel white matter disease. Electronically Signed   By: Lauralyn Primes M.D.   On: 01/18/2019 15:34   US Renal  Result Date: January 18, 2019 CLINICAL DATA:  Acute renal injury. EXAM: RENAL / URINARY TRACT ULTRASOUND COMPLETE COMPARISON:  None. FINDINGS: Right Kidney: Renal measurements: 10.8 x 4.6 x 5.1 cm = volume: 132 mL. Increased cortical echogenicity. Left Kidney: Renal  measurements: 11.3 x 4.4 x 5.1 cm = volume: 130.9 mL. Increased cortical echogenicity. Bladder: Decompressed with a Foley catheter. IMPRESSION: Medical renal disease.  No other acute abnormalities. Electronically Signed   By: Gerome Sam III M.D   On: 01-18-19 20:08   Dg Chest Port 1 View  Result Date: 01/02/2019 CLINICAL DATA:  Shortness of breath EXAM: PORTABLE CHEST 1 VIEW COMPARISON:  18-Jan-2019 FINDINGS: Cardiac shadow is stable. Endotracheal tube and gastric catheter are noted. Increasing left retrocardiac consolidation is noted. No sizable effusion is seen. No bony abnormality is noted. IMPRESSION: Increasing left retrocardiac consolidation. Electronically Signed   By: Alcide Clever M.D.   On: 01/02/2019 05:59   Dg Chest Portable 1 View  Result Date: 2019-01-18 CLINICAL DATA:  Evaluate ETT and NG tube placement EXAM: PORTABLE CHEST 1 VIEW COMPARISON:  December 22, 2004 FINDINGS: The ETT terminates 8 mm above the carina. Recommend withdrawing 2 cm. The NG tube terminates below today's film, within the abdomen. No pneumothorax. The right lung is clear. Mild patchy hazy opacity in the left lung is nonspecific. The cardiomediastinal silhouette is unremarkable. IMPRESSION: 1. The ETT terminates near the carina. Recommend withdrawing 2 cm. The NG tube terminates below today's film. 2. Mild hazy patchy opacity in left lung could represent atelectasis due to mildly reduced volumes and patchy atelectasis versus a developing infiltrate. Recommend attention on follow-up. 3. No other acute abnormalities. Findings called to Dr. Jeraldine Loots. Electronically Signed   By: Gerome Sam III M.D   On: 18-Jan-2019 15:18   EEG 3/9: This is an abnormal EEG secondary to general background slowing.  This finding may be seen with a diffuse disturbance that is etiologically nonspecific, but may include a metabolic encephalopathy or medication effect, among other possibilities.  No epileptiform activity was  noted  Assessment: 58 year old female who presented with cardiac arrest  with unknown downtime, now with ongoing encephalopathy and UMN findings despite discontinuation of sedation.  1. EEG yesterday revealed generalized background slowing - at that time she had been off of sedation for a couple of hours but still had significant AKI which could decrease clearance contributing to EEG findings. However her encephalopathy seems to have worsened since yesterday and would expect at least stability or improvement if purely due to to sedating medications. She is showing signs of brain stem and cortical damage on exam. 2. Neurological DDx includes diffuse anoxic brain injury due to prolonged arrest possibly synergistic with excitotoxic effects of cocaine and opiates.  3. AKI and transaminitis thought to be due to shock are improving and are typical of the profile of lab abnormalities seen due to anoxic end organ damage in cardiac arrest. She has not received sedating meds for at least 24 hours, but these may be taking longer to clear due to renal and hepatic impairment, which could also explain her clinical neurological worsening.  4. She is getting appropriate antibiotic coverage for pneumonia; phosphorus is low at 1.6, but other electrolytes unremarkable.  Recommendations: 1. Avoid sedating meds 2. Continue supportive care for AKI, transaminitis 3. Maintain electrolytes wnl 4. MRI brain wo contrast 5. Frequent neuro checks.  Nyra Market, MD PGY3  A total of 45 minutes were spent in the neurological evaluation and management of this critically ill patient.   I have performed the neurological examination, which was observed and documented by Dr. Nyra Market. I have formulated the assessment and recommendations.  Electronically signed: Dr. Caryl Pina  Addendum: MRI brain performed in the early AM reveals numerous nonhemorrhagic late acute/early subacute infarctions throughout the supratentorial  white matter, few cortical based infarct. Findings favor drug-induced vasculitis/vasospasm, a component of watershed ischemia may be present. Small area of curvilinear cortical signal abnormality in left middle frontal gyrus may represent an additional focus of ischemia or possibly seizure activity.  Electronically signed: Dr. Caryl Pina

## 2019-01-03 ENCOUNTER — Inpatient Hospital Stay (HOSPITAL_COMMUNITY): Payer: BLUE CROSS/BLUE SHIELD

## 2019-01-03 ENCOUNTER — Other Ambulatory Visit: Payer: Self-pay

## 2019-01-03 DIAGNOSIS — R569 Unspecified convulsions: Secondary | ICD-10-CM

## 2019-01-03 DIAGNOSIS — R740 Nonspecific elevation of levels of transaminase and lactic acid dehydrogenase [LDH]: Secondary | ICD-10-CM

## 2019-01-03 DIAGNOSIS — D72829 Elevated white blood cell count, unspecified: Secondary | ICD-10-CM

## 2019-01-03 DIAGNOSIS — R509 Fever, unspecified: Secondary | ICD-10-CM

## 2019-01-03 DIAGNOSIS — J810 Acute pulmonary edema: Secondary | ICD-10-CM

## 2019-01-03 DIAGNOSIS — I6330 Cerebral infarction due to thrombosis of unspecified cerebral artery: Secondary | ICD-10-CM

## 2019-01-03 DIAGNOSIS — F1410 Cocaine abuse, uncomplicated: Secondary | ICD-10-CM

## 2019-01-03 LAB — CBC WITH DIFFERENTIAL/PLATELET
Abs Immature Granulocytes: 0.05 10*3/uL (ref 0.00–0.07)
BASOS ABS: 0 10*3/uL (ref 0.0–0.1)
Basophils Relative: 0 %
Eosinophils Absolute: 0.1 10*3/uL (ref 0.0–0.5)
Eosinophils Relative: 1 %
HCT: 34.7 % — ABNORMAL LOW (ref 36.0–46.0)
Hemoglobin: 11.5 g/dL — ABNORMAL LOW (ref 12.0–15.0)
Immature Granulocytes: 0 %
Lymphocytes Relative: 13 %
Lymphs Abs: 1.5 10*3/uL (ref 0.7–4.0)
MCH: 29 pg (ref 26.0–34.0)
MCHC: 33.1 g/dL (ref 30.0–36.0)
MCV: 87.6 fL (ref 80.0–100.0)
Monocytes Absolute: 0.8 10*3/uL (ref 0.1–1.0)
Monocytes Relative: 7 %
NEUTROS ABS: 9.1 10*3/uL — AB (ref 1.7–7.7)
Neutrophils Relative %: 79 %
Platelets: 167 10*3/uL (ref 150–400)
RBC: 3.96 MIL/uL (ref 3.87–5.11)
RDW: 14.2 % (ref 11.5–15.5)
WBC: 11.6 10*3/uL — ABNORMAL HIGH (ref 4.0–10.5)
nRBC: 0 % (ref 0.0–0.2)

## 2019-01-03 LAB — BASIC METABOLIC PANEL
Anion gap: 4 — ABNORMAL LOW (ref 5–15)
BUN: 16 mg/dL (ref 6–20)
CHLORIDE: 114 mmol/L — AB (ref 98–111)
CO2: 26 mmol/L (ref 22–32)
Calcium: 8.7 mg/dL — ABNORMAL LOW (ref 8.9–10.3)
Creatinine, Ser: 1.03 mg/dL — ABNORMAL HIGH (ref 0.44–1.00)
GFR calc Af Amer: >60 mL/min (ref >60)
GFR calc non Af Amer: >60 mL/min (ref >60)
GLUCOSE: 103 mg/dL — AB (ref 70–99)
Potassium: 4.5 mmol/L (ref 3.5–5.1)
Sodium: 144 mmol/L (ref 135–145)

## 2019-01-03 LAB — TRIGLYCERIDES: Triglycerides: 107 mg/dL (ref <150)

## 2019-01-03 LAB — GLUCOSE, CAPILLARY
GLUCOSE-CAPILLARY: 96 mg/dL (ref 70–99)
GLUCOSE-CAPILLARY: 103 mg/dL — AB (ref 70–99)
GLUCOSE-CAPILLARY: 119 mg/dL — AB (ref 70–99)
Glucose-Capillary: 108 mg/dL — ABNORMAL HIGH (ref 70–99)
Glucose-Capillary: 110 mg/dL — ABNORMAL HIGH (ref 70–99)
Glucose-Capillary: 113 mg/dL — ABNORMAL HIGH (ref 70–99)

## 2019-01-03 LAB — PHOSPHORUS: Phosphorus: 2.4 mg/dL — ABNORMAL LOW (ref 2.5–4.6)

## 2019-01-03 LAB — MAGNESIUM: Magnesium: 1.8 mg/dL (ref 1.7–2.4)

## 2019-01-03 MED ORDER — CEFAZOLIN SODIUM-DEXTROSE 2-4 GM/100ML-% IV SOLN
2.0000 g | Freq: Three times a day (TID) | INTRAVENOUS | Status: AC
  Administered 2019-01-03 – 2019-01-06 (×11): 2 g via INTRAVENOUS
  Filled 2019-01-03 (×11): qty 100

## 2019-01-03 MED ORDER — SODIUM CHLORIDE 0.9 % IV SOLN
INTRAVENOUS | Status: DC | PRN
  Administered 2019-01-03: 500 mL via INTRAVENOUS

## 2019-01-03 MED ORDER — IOHEXOL 350 MG/ML SOLN
75.0000 mL | Freq: Once | INTRAVENOUS | Status: AC | PRN
  Administered 2019-01-03: 75 mL via INTRAVENOUS

## 2019-01-03 MED ORDER — ASPIRIN 81 MG PO CHEW
81.0000 mg | CHEWABLE_TABLET | Freq: Every day | ORAL | Status: DC
  Administered 2019-01-03 – 2019-01-14 (×12): 81 mg via ORAL
  Filled 2019-01-03 (×12): qty 1

## 2019-01-03 NOTE — Progress Notes (Signed)
Received call from radiologist regarding results from MRI.  This RN was advised to make ordering MD aware of results.  Text page sent to Dr. Wilford Corner with Neurology.

## 2019-01-03 NOTE — Progress Notes (Signed)
Patient transported to MRI and back to 3M11 without incidence.

## 2019-01-03 NOTE — Progress Notes (Signed)
Received call back from Dr. Wilford Corner and he advised they would follow up in the morning.

## 2019-01-03 NOTE — Progress Notes (Signed)
Pt transported by RN, RT and CNA from 3M11 to CT Scan and back without any complications.

## 2019-01-03 NOTE — Progress Notes (Addendum)
PULMONARY / CRITICAL CARE MEDICINE   NAME:  Rita Santos, MRN:  [id number], DOB:  04-26-1961, LOS: 3 ADMISSION DATE:  01/27/2019, CONSULTATION DATE:  2019-01-27 REFERRING MD:  Dr. 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  Received bystander CPR for about 10 minutes and then 2 minutes by fire department before ROSC.  Received narcan in field w/o response.  Intubated in ER.    HISTORY OF PRESENT ILLNESS:  History from ER staff and patient's coworker.  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  Received bystander CPR for about 10 minutes and then 2 minutes by fire department before ROSC.  Received narcan in field w/o response.  Intubated in ER.    She lives with her elderly parents in [place].  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  No recent exposures to anyone with respiratory infection.  Patient reported that she ate something funny on 3/07 and wasn't feeling well.  Instead of going home she went to sleep at home of the family she cares for.  She apparently told them that she felt like she would wake up after going to sleep.  She went to bed around 9 pm on 3/07.  In am of 3/08 she was snoring, and wouldn't wake up.  The family called another caregiver who arrived around 1230 pm.  She immediately recognized something was wrong, had them call 911 and started CPR.  She did this for about 10 minutes and then fire department did another 2 minutes of CPR.  No shock needed per AED.  She remained hypotensive and not able to protect airway.  Intubated in ER and started on epinephrine.  Police are trying to locate her family.  SIGNIFICANT PAST MEDICAL HISTORY   Bipolar, Chronic opiate medication use, HTN, Allergies,  Insomnia  SIGNIFICANT EVENTS:  3/08 Admit  STUDIES:   CT head 3/08 >> small vessel white matter disease 01/02/2019 MRI head.  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  She cortical-based infarcts.  Findings favor drug-induced vasculitis/vasospasm.  Left middle frontal gyrus cortical signal abnormality could questionably be ischemic or possibly seizure activity.  CULTURES:  Blood 3/08 >> Sputum 3/08 >> staph aureus>> Influenza PCR 3/08 >> Respiratory viral panel 3/08 >> Pneumococcal Ag 3/08 >> 12/2018+ MRSA screen nasopharynx ANTIBIOTICS:  Vancomycin 3/08 >> Zosyn 3/08 >>   LINES/TUBES:  ETT 3/08 >>   CONSULTANTS:    SUBJECTIVE:  Does not follow commands off sedation  CONSTITUTIONAL: BP (!) 188/97   Pulse 75   Temp 100.2 F (37.9 C) (Oral)   Resp 20   Ht  (1.676 m)   Wt 87.8 kg   SpO2 100%   BMI 31.24 kg/m   I/O last 3 completed shifts: In: 5524.8 [I.V.:2571.3; NG/GT:2160; IV Piggyback:793.5] Out: 2081 [Urine:2081]     Vent Mode: PRVC FiO2 (%):  [40 %] 40 % Set Rate:  [20 bmp] 20 bmp Vt Set:  [470 mL] 470 mL PEEP:  [5 cmH20] 5 cmH20 Plateau Pressure:  [13 cmH20-19 cmH20] 19 cmH20  PHYSICAL EXAM:  General: Is older than stated age HEENT: Tracheal  tube in place, conjugate gaze pupils equal reactive light Neuro: Does not follow commands.  Withdraws to noxious stimuli CV: s1s2 rrr, no m/r/g PULM: even/non-labored, lungs bilaterally decreased in the bases BU:LAGT, non-tender, bsx4 active  Extremities: warm/dry, no edema  Skin: no rashes or lesions   01/03/2019 chest x-ray reviewed endotracheal tube in good position no acute abnormalities noted    RESOLVED PROBLEM LIST   ASSESSMENT AND PLAN    Acute respiratory failure with hypoxia and compromised airway. Plan -Wean FiO2 as tolerated -Mental status precludes extubation at this time -May need tracheostomy in the future due to  neurological injury   Shock with concern for sepsis and aspiration pneumonia. Plan -Currently off pressors  Acute metabolic encephalopathy. Hx of Bipolar, Chronic opiate use. Plan -Currently off sedation -Neurology consult is appreciated -MRI with infarcts as noted -Continue to monitor  Acute renal failure from ATN >> baseline creatinine 1.08 from 09/20/17. Lab Results  Component Value Date   CREATININE 1.03 (H) 01/03/2019   CREATININE 1.55 (H) 01/02/2019   CREATININE 3.06 (H) 01/01/2019   Recent Labs  Lab 01/02/19 0328 01/02/19 0355 01/03/19 0308  K 3.7 3.7 4.5    Plan -Renal function improving with hydration -Continue to monitor renal function -Strict output and intake  Elevated LFTs from hypoxia, shock. Plan -LFTs are slowly improving continue to monitor  Best Practice / Goals of Care / Disposition.   DVT PROPHYLAXIS: SCDs SUP: Protonix NUTRITION: NPO MOBILITY: Bed rest GOALS OF CARE: full code FAMILY DISCUSSIONS: 01/03/2019 husband updated bedside DISPOSITION: ICU  LABS  Glucose Recent Labs  Lab 01/02/19 1206 01/02/19 1557 01/02/19 2003 01/02/19 2339 01/03/19 0350 01/03/19 0800  GLUCAP 129* 136* 132* 123* 96 103*    BMET Recent Labs  Lab 01/01/19 0308  01/02/19 0328 01/02/19 0355 01/03/19 0308  NA 144   < > 147* 144 144  K 4.0   < > 3.7 3.7 4.5  CL 112*  --   --  116* 114*  CO2 21*  --   --  22 26  BUN 40*  --   --  26* 16  CREATININE 3.06*  --   --  1.55* 1.03*  GLUCOSE 87  --   --  127* 103*   < > = values in this interval not displayed.    Liver Enzymes Recent Labs  Lab 01-10-2019 1434 01/01/19 0308 01/02/19 0355  AST 146* 355* 145*  ALT 115* 289* 208*  ALKPHOS 75 55 55  BILITOT 0.6 1.1 0.7  ALBUMIN 4.2 3.4* 2.8*    Electrolytes Recent Labs  Lab 01/01/19 0308  01/02/19 0355 01/02/19 1653 01/03/19 0308  CALCIUM 8.2*  --  8.7*  --  8.7*  MG 1.8   < > 2.1 1.9 1.8  PHOS 2.7   < > 1.6* 2.7 2.4*   < > = values in this  interval not displayed.    CBC Recent Labs  Lab 01/01/19 0640 01/02/19 0328 01/02/19 0355 01/03/19 0308  WBC 15.4*  --  10.9* 11.6*  HGB 11.8* 10.2* 11.1* 11.5*  HCT 36.3 30.0* 33.5* 34.7*  PLT 230  --  178 167    ABG Recent Labs  Lab 01/10/19 1842 01/01/19 0437 01/02/19 0328  PHART 7.284* 7.451* 7.392  PCO2ART 47.2 28.3* 40.2  PO2ART 245* 73.0* 122.0*    Coag's Recent Labs  Lab Jan 10, 2019 1434 01/10/19 1724  APTT  --  22*  INR 1.1 1.1    Sepsis Markers Recent Labs  Lab 01/09/2019 1435 12/26/2018 1724 12/30/2018 1925  LATICACIDVEN 8.4* 4.2* 3.1*  PROCALCITON  --  3.73  --     Cardiac Enzymes Recent Labs  Lab 01/03/2019 1434  TROPONINI 0.08*   App ctt 35 min  Brett Canales Minor ACNP [facility] Pager [phone] till 1 pm If no answer page [phone]  Attending Note:  58 year old female with PMH of polysubstance abuse who presents to PCCM after a cardiac arrest.  I suspect patient has more neurologic damage than previously anticipated.  On exam, remains unresponsive with clear lungs.  I reviewed CXR myself, ETT is in a good position.  Discussed with PCCM-NP.   EEG noted.  Neurology consult appreciated.  Mental status precludes extubation at this point.  MRI pending.  Will consider tracheostomy towards the end of the week if mental status remains an issue.  Hold off sedation.  The patient is critically ill with multiple organ systems failure and requires high complexity decision making for assessment and support, frequent evaluation and titration of therapies, application of advanced monitoring technologies and extensive interpretation of multiple databases.   Critical Care Time devoted to patient care services described in this note is  33  Minutes. This time reflects time of care of this signee Dr Koren Bound. This critical care time does not reflect procedure time, or teaching time or supervisory time of PA/NP/Med student/Med Resident etc but could involve care  discussion time.  Alyson Reedy, M.D. [facility] Pulmonary/Critical Care Medicine. Pager: [phone]. After hours pager: [phone].

## 2019-01-03 NOTE — Progress Notes (Signed)
STROKE TEAM PROGRESS NOTE   SUBJECTIVE (INTERVAL HISTORY) Her husband and daughter and son are at the bedside.  Patient still intubated, on ventilation, with minimum sedation.  Patient not open eyes on pain or voice, but withdraws to pain in all extremities.  Given concern of seizure activity, will put on long-term EEG.   OBJECTIVE Temp:  [99 F (37.2 C)-100.2 F (37.9 C)] 99.5 F (37.5 C) (03/11 1535) Pulse Rate:  [64-122] 64 (03/11 1700) Cardiac Rhythm: Normal sinus rhythm (03/11 1600) Resp:  [19-28] 20 (03/11 1700) BP: (134-205)/(75-119) 143/75 (03/11 1700) SpO2:  [97 %-100 %] 99 % (03/11 1700) FiO2 (%):  [40 %] 40 % (03/11 1600) Weight:  [87.8 kg] 87.8 kg (03/11 0241)  Recent Labs  Lab 01/02/19 2339 01/03/19 0350 01/03/19 0800 01/03/19 1135 01/03/19 1536  GLUCAP 123* 96 103* 119* 108*   Recent Labs  Lab 2019/08/31 1434  01/01/19 0308 01/01/19 0437 01/01/19 1305 01/01/19 1556 01/02/19 0328 01/02/19 0355 01/02/19 1653 01/03/19 0308  NA 145  --  144 142  --   --  147* 144  --  144  K 4.6  --  4.0 3.8  --   --  3.7 3.7  --  4.5  CL 103  --  112*  --   --   --   --  116*  --  114*  CO2 21*  --  21*  --   --   --   --  22  --  26  GLUCOSE 132*  --  87  --   --   --   --  127*  --  103*  BUN 31*  --  40*  --   --   --   --  26*  --  16  CREATININE 4.23*  --  3.06*  --   --   --   --  1.55*  --  1.03*  CALCIUM 8.5*  --  8.2*  --   --   --   --  8.7*  --  8.7*  MG 2.6*  --  1.8  --  1.9 2.0  --  2.1 1.9 1.8  PHOS  --    < > 2.7  --  2.4* 2.3*  --  1.6* 2.7 2.4*   < > = values in this interval not displayed.   Recent Labs  Lab 2019/08/31 1434 01/01/19 0308 01/02/19 0355  AST 146* 355* 145*  ALT 115* 289* 208*  ALKPHOS 75 55 55  BILITOT 0.6 1.1 0.7  PROT 7.5 5.8* 5.4*  ALBUMIN 4.2 3.4* 2.8*   Recent Labs  Lab 2019/08/31 1434 01/01/19 0437 01/01/19 0640 01/02/19 0328 01/02/19 0355 01/03/19 0308  WBC 22.0*  --  15.4*  --  10.9* 11.6*  NEUTROABS 16.9*  --   --    --   --  9.1*  HGB 14.0 10.9* 11.8* 10.2* 11.1* 11.5*  HCT 47.6* 32.0* 36.3 30.0* 33.5* 34.7*  MCV 96.4  --  86.8  --  87.7 87.6  PLT 312  --  230  --  178 167   Recent Labs  Lab 2019/08/31 1434  TROPONINI 0.08*   No results for input(s): LABPROT, INR in the last 72 hours. No results for input(s): COLORURINE, LABSPEC, PHURINE, GLUCOSEU, HGBUR, BILIRUBINUR, KETONESUR, PROTEINUR, UROBILINOGEN, NITRITE, LEUKOCYTESUR in the last 72 hours.  Invalid input(s): APPERANCEUR     Component Value Date/Time   TRIG 107 01/03/2019 1621   No results found  for: HGBA1C    Component Value Date/Time   LABOPIA POSITIVE (A) 12/26/2018 1710   COCAINSCRNUR POSITIVE (A) 12/27/2018 1710   LABBENZ NONE DETECTED 12/26/2018 1710   AMPHETMU NONE DETECTED 12/26/2018 1710   THCU NONE DETECTED 01/10/2019 1710   LABBARB NONE DETECTED 01/13/2019 1710    Recent Labs  Lab 01/10/2019 1724  ETH <10    I have personally reviewed the radiological images below and agree with the radiology interpretations.  Ct Angio Head W Or Wo Contrast  Result Date: 01/03/2019 CLINICAL DATA:  Followup extensive bilateral watershed infarctions. EXAM: CT ANGIOGRAPHY HEAD AND NECK TECHNIQUE: Multidetector CT imaging of the head and neck was performed using the standard protocol during bolus administration of intravenous contrast. Multiplanar CT image reconstructions and MIPs were obtained to evaluate the vascular anatomy. Carotid stenosis measurements (when applicable) are obtained utilizing NASCET criteria, using the distal internal carotid diameter as the denominator. CONTRAST:  75mL OMNIPAQUE IOHEXOL 350 MG/ML SOLN COMPARISON:  MRI earlier today.  CT 01/16/2019 FINDINGS: CT HEAD FINDINGS Brain: Low-density within the hemispheric deep white matter in a patchy pattern. No large vessel infarction. No hemorrhage, hydrocephalus or extra-axial collection. Vascular: Negative by CT. Skull: Negative Sinuses: Mucosal inflammatory changes of the  right maxillary sinus. Orbits: Negative Review of the MIP images confirms the above findings CTA NECK FINDINGS Aortic arch: Mild atherosclerotic change of the aorta. No sign of aneurysm or dissection. Branching pattern is normal. Left vertebral artery arises from the arch. Right carotid system: Common carotid artery widely patent to the bifurcation. Soft and calcified plaque at the bifurcation and ICA bulb. Minimal diameter of the ICA bulb 4 mm. Compared to a more distal cervical ICA diameter of 4.5 mm, this indicates a 10% stenosis. Left carotid system: Common carotid artery widely patent to the bifurcation. Calcified plaque at the ICA bulb but no stenosis. Cervical ICA is tortuous but widely patent. Vertebral arteries: Left vertebral artery arises from the arch as noted above. The vessel appears widely patent through the cervical region to the foramen magnum. The right vertebral artery is patent, but difficult to evaluate accurately because of extensive venous reflux. No origin stenosis is suspected. Skeleton: Ordinary cervical spondylosis. Other neck: No mass or lymphadenopathy. Upper chest: Mild dependent atelectasis of the upper lungs. Review of the MIP images confirms the above findings CTA HEAD FINDINGS Anterior circulation: Both internal carotid arteries patent through the skull base and siphon regions. Siphon atherosclerotic calcification but no stenosis. The anterior and middle cerebral vessels are patent without proximal stenosis, aneurysm or vascular malformation. No evidence of vasculitic irregularity. Posterior circulation: Both vertebral arteries are patent through the foramen magnum to the basilar. No basilar stenosis. Posterior circulation branch vessels are patent and normal. Venous sinuses: Patent and normal. Anatomic variants: None significant. Delayed phase: No abnormal enhancement. Review of the MIP images confirms the above findings IMPRESSION: No evidence of vasculitis by CT angiography. No  large or medium vessel occlusion or flow restricting stenosis. Atherosclerotic disease at both carotid bifurcations, right worse than left. 10% stenosis of the right ICA bulb. No stenosis on the left. Therefore, acute infarctions are most likely due to watershed/hypoperfusion infarction. Electronically Signed   By: Paulina Fusi M.D.   On: 01/03/2019 12:12   Ct Head Wo Contrast  Result Date: 01/12/2019 CLINICAL DATA:  Loss of consciousness, post arrest EXAM: CT HEAD WITHOUT CONTRAST TECHNIQUE: Contiguous axial images were obtained from the base of the skull through the vertex without intravenous contrast. COMPARISON:  None. FINDINGS: Brain: No evidence of acute infarction, hemorrhage, hydrocephalus, extra-axial collection or mass lesion/mass effect. Periventricular white matter hypodensity. Vascular: No hyperdense vessel or unexpected calcification. Skull: Normal. Negative for fracture or focal lesion. Sinuses/Orbits: No acute finding. Other: None. IMPRESSION: No acute intracranial pathology.  Small-vessel white matter disease. Electronically Signed   By: Lauralyn Primes M.D.   On: 01/16/2019 15:34   Ct Angio Neck W Or Wo Contrast  Result Date: 01/03/2019 CLINICAL DATA:  Followup extensive bilateral watershed infarctions. EXAM: CT ANGIOGRAPHY HEAD AND NECK TECHNIQUE: Multidetector CT imaging of the head and neck was performed using the standard protocol during bolus administration of intravenous contrast. Multiplanar CT image reconstructions and MIPs were obtained to evaluate the vascular anatomy. Carotid stenosis measurements (when applicable) are obtained utilizing NASCET criteria, using the distal internal carotid diameter as the denominator. CONTRAST:  58mL OMNIPAQUE IOHEXOL 350 MG/ML SOLN COMPARISON:  MRI earlier today.  CT 01/01/2019 FINDINGS: CT HEAD FINDINGS Brain: Low-density within the hemispheric deep white matter in a patchy pattern. No large vessel infarction. No hemorrhage, hydrocephalus or  extra-axial collection. Vascular: Negative by CT. Skull: Negative Sinuses: Mucosal inflammatory changes of the right maxillary sinus. Orbits: Negative Review of the MIP images confirms the above findings CTA NECK FINDINGS Aortic arch: Mild atherosclerotic change of the aorta. No sign of aneurysm or dissection. Branching pattern is normal. Left vertebral artery arises from the arch. Right carotid system: Common carotid artery widely patent to the bifurcation. Soft and calcified plaque at the bifurcation and ICA bulb. Minimal diameter of the ICA bulb 4 mm. Compared to a more distal cervical ICA diameter of 4.5 mm, this indicates a 10% stenosis. Left carotid system: Common carotid artery widely patent to the bifurcation. Calcified plaque at the ICA bulb but no stenosis. Cervical ICA is tortuous but widely patent. Vertebral arteries: Left vertebral artery arises from the arch as noted above. The vessel appears widely patent through the cervical region to the foramen magnum. The right vertebral artery is patent, but difficult to evaluate accurately because of extensive venous reflux. No origin stenosis is suspected. Skeleton: Ordinary cervical spondylosis. Other neck: No mass or lymphadenopathy. Upper chest: Mild dependent atelectasis of the upper lungs. Review of the MIP images confirms the above findings CTA HEAD FINDINGS Anterior circulation: Both internal carotid arteries patent through the skull base and siphon regions. Siphon atherosclerotic calcification but no stenosis. The anterior and middle cerebral vessels are patent without proximal stenosis, aneurysm or vascular malformation. No evidence of vasculitic irregularity. Posterior circulation: Both vertebral arteries are patent through the foramen magnum to the basilar. No basilar stenosis. Posterior circulation branch vessels are patent and normal. Venous sinuses: Patent and normal. Anatomic variants: None significant. Delayed phase: No abnormal enhancement.  Review of the MIP images confirms the above findings IMPRESSION: No evidence of vasculitis by CT angiography. No large or medium vessel occlusion or flow restricting stenosis. Atherosclerotic disease at both carotid bifurcations, right worse than left. 10% stenosis of the right ICA bulb. No stenosis on the left. Therefore, acute infarctions are most likely due to watershed/hypoperfusion infarction. Electronically Signed   By: Paulina Fusi M.D.   On: 01/03/2019 12:12   Mr Brain Wo Contrast  Result Date: 01/03/2019 CLINICAL DATA:  58 y/o F; found unresponsive, cardiac arrest, CPR by family and EMS, course complicated by likely aspiration pneumonia and shock, continued neurologic decline. UDS positive for cocaine and opiates. EXAM: MRI HEAD WITHOUT CONTRAST TECHNIQUE: Multiplanar, multiecho pulse sequences of the brain and  surrounding structures were obtained without intravenous contrast. COMPARISON:  01/07/2019 CT head. FINDINGS: Brain: Numerous foci of reduced diffusion are present throughout white matter of the supratentorial brain with increased T2 signal and minimal local mass effect compatible with late acute/early subacute infarctions. A few punctate foci of late acute/early subacute infarction are present in the biparietal cortices. No lesion is present within the basal ganglia or posterior fossa. Curvilinear reduced diffusion and increased T2 signal is present within a small area of cortex in the left middle frontal gyrus. Punctate foci of susceptibility hypointensity are present within the left cerebellar hemisphere, left parietal lobe, and right medial occipital lobe compatible with hemosiderin deposition of chronic microhemorrhage. Foci of chronic microhemorrhage are distinct from areas of recent infarction. No extra-axial collection, hydrocephalus, or herniation. Vascular: Normal flow voids. Skull and upper cervical spine: Normal marrow signal. Sinuses/Orbits: Paranasal sinus mucosal thickening and  mastoid effusions, likely due to intubation. Orbits are unremarkable. Other: None. IMPRESSION: 1. Numerous nonhemorrhagic late acute/early subacute infarctions throughout the supratentorial white matter, few cortical based infarct. Findings favor drug-induced vasculitis/vasospasm, a component of watershed ischemia may be present. 2. Small area of curvilinear cortical signal abnormality in left middle frontal gyrus may represent an additional focus of ischemia or possibly seizure activity. These results will be called to the ordering clinician or representative by the Radiologist Assistant, and communication documented in the PACS or zVision Dashboard. Electronically Signed   By: Mitzi Hansen M.D.   On: 01/03/2019 02:40     PHYSICAL EXAM  Temp:  [99 F (37.2 C)-100.2 F (37.9 C)] 99.5 F (37.5 C) (03/11 1535) Pulse Rate:  [64-122] 64 (03/11 1700) Resp:  [19-28] 20 (03/11 1700) BP: (134-205)/(75-119) 143/75 (03/11 1700) SpO2:  [97 %-100 %] 99 % (03/11 1700) FiO2 (%):  [40 %] 40 % (03/11 1600) Weight:  [87.8 kg] 87.8 kg (03/11 0241)  General - Well nourished, well developed, intubated not on sedation.  Ophthalmologic - fundi not visualized due to noncooperation.  Cardiovascular - Regular rate and rhythm.  Neuro - intubated not on sedation, eyes closed, not following commands. With forced eye opening, eyes in mid position, not blinking to visual threat, doll's eyes present, not tracking, PERRL. Corneal reflex present bilaterally, gag and cough present. Breathing over the vent.  Facial symmetry not able to test due to ET tube.  Tongue midline in mouth. On pain stimulation, mild withdraw to all extremities. DTR 1+ and bilateral babinski. Sensation, coordination and gait not tested.   ASSESSMENT/PLAN Ms. Rita Santos is a 58 y.o. female with history of bipolar disorder and hypertension admitted for cardio arrest with AKI, leukocytosis, respiratory failure, metabolic acidosis.   Initially improving mental status, but then had mental status decline.  MRI showed watershed infarcts bilaterally.  No tPA given due to outside window.    Stroke:  bilateral watershed border zone infarcts, consistent with hypoperfusion secondary to cardio arrest   Resultant intubated, nonresponsive  CT no acute abnormality  MRI bilateral watershed border zone infarcts  CT head and neck right ICA bulb atherosclerosis, no LVO, no mycotic aneurysm or vasculitis  2D Echo EF 60 to 65%  LDL pending  HgbA1c pending  UDS positive for cocaine and opiates  Heparin subq for VTE prophylaxis  NPO  No antithrombotic prior to admission, now on aspirin 81 mg daily.   Ongoing aggressive stroke risk factor management  Therapy recommendations:  Pending   Disposition:  Pending   Seizure-like activity  Episodes of bilateral upper extremity posturing  Concern for seizure activity  Spot EEG generalized slowing no seizure  Now put on long-term EEG  Fever and leukocytosis  T-max 101.1->100.2  Leukocytosis WBC 22.0-15.4-10.9-11.6  Blood culture negative so far  On Ancef and vancomycin  Continue monitoring  Respiratory failure  Intubated on ventilation  CCM on board  Mental status not appropriate for extubation  Continue supportive care  Cocaine abuse  UDS showed positive for cocaine and opiates  Concerning for substance overdose  Supportive care  Cocaine cessation education will be provided later  Tobacco abuse  Current smoker  Smoking cessation counseling will be provided later  Hypertension . Stable on the higher end . Permissive hypertension (OK if <180/105) for 24-48 hours post stroke and then gradually normalized within 3-5 days.  Long term BP goal normotensive  Other Stroke Risk Factors  Limit ETOH use  Obesity, Body mass index is 31.24 kg/m.   Other Active Problems  AKI, creatinine 1.55-1.03  Leukocytosis WBC  22-15.4-10.9-11.6    Hospital day # 3  This patient is critically ill due to cardio arrest, watershed infarcts bilaterally, cocaine abuse, seizure-like activity, respiratory failure and at significant risk of neurological worsening, death form recurrent stroke, seizure, heart failure, sepsis. This patient's care requires constant monitoring of vital signs, hemodynamics, respiratory and cardiac monitoring, review of multiple databases, neurological assessment, discussion with family, other specialists and medical decision making of high complexity. I spent 40 minutes of neurocritical care time in the care of this patient. I had long discussion with daughter, son, husband at bedside, updated pt current condition, treatment plan and potential prognosis. They expressed understanding and appreciation.     Marvel Plan, MD PhD Stroke Neurology 01/03/2019 5:50 PM    To contact Stroke Continuity provider, please refer to [email or web address]. After hours, contact General Neurology

## 2019-01-03 NOTE — Progress Notes (Signed)
LTM EEG hooked up and running - push button tested. No initial skin breakdown;

## 2019-01-04 ENCOUNTER — Inpatient Hospital Stay (HOSPITAL_COMMUNITY): Payer: BLUE CROSS/BLUE SHIELD

## 2019-01-04 DIAGNOSIS — J9690 Respiratory failure, unspecified, unspecified whether with hypoxia or hypercapnia: Secondary | ICD-10-CM

## 2019-01-04 DIAGNOSIS — I6330 Cerebral infarction due to thrombosis of unspecified cerebral artery: Secondary | ICD-10-CM

## 2019-01-04 DIAGNOSIS — F17200 Nicotine dependence, unspecified, uncomplicated: Secondary | ICD-10-CM

## 2019-01-04 LAB — CULTURE, RESPIRATORY W GRAM STAIN

## 2019-01-04 LAB — CBC
HEMATOCRIT: 36.9 % (ref 36.0–46.0)
Hemoglobin: 11.9 g/dL — ABNORMAL LOW (ref 12.0–15.0)
MCH: 28.3 pg (ref 26.0–34.0)
MCHC: 32.2 g/dL (ref 30.0–36.0)
MCV: 87.6 fL (ref 80.0–100.0)
Platelets: 176 10*3/uL (ref 150–400)
RBC: 4.21 MIL/uL (ref 3.87–5.11)
RDW: 13.5 % (ref 11.5–15.5)
WBC: 8.6 10*3/uL (ref 4.0–10.5)
nRBC: 0 % (ref 0.0–0.2)

## 2019-01-04 LAB — GLUCOSE, CAPILLARY
GLUCOSE-CAPILLARY: 105 mg/dL — AB (ref 70–99)
GLUCOSE-CAPILLARY: 111 mg/dL — AB (ref 70–99)
Glucose-Capillary: 119 mg/dL — ABNORMAL HIGH (ref 70–99)
Glucose-Capillary: 136 mg/dL — ABNORMAL HIGH (ref 70–99)
Glucose-Capillary: 127 mg/dL — ABNORMAL HIGH (ref 70–99)
Glucose-Capillary: 103 mg/dL — ABNORMAL HIGH (ref 70–99)

## 2019-01-04 LAB — BASIC METABOLIC PANEL
ANION GAP: 8 (ref 5–15)
BUN: 15 mg/dL (ref 6–20)
CO2: 24 mmol/L (ref 22–32)
Calcium: 8.4 mg/dL — ABNORMAL LOW (ref 8.9–10.3)
Chloride: 110 mmol/L (ref 98–111)
Creatinine, Ser: 0.86 mg/dL (ref 0.44–1.00)
GFR calc Af Amer: >60 mL/min (ref >60)
GFR calc non Af Amer: >60 mL/min (ref >60)
GLUCOSE: 131 mg/dL — AB (ref 70–99)
Potassium: 3.6 mmol/L (ref 3.5–5.1)
Sodium: 142 mmol/L (ref 135–145)

## 2019-01-04 LAB — MAGNESIUM: Magnesium: 1.8 mg/dL (ref 1.7–2.4)

## 2019-01-04 LAB — CULTURE, RESPIRATORY

## 2019-01-04 LAB — LIPID PANEL
CHOL/HDL RATIO: 3.8 ratio
Cholesterol: 136 mg/dL (ref 0–200)
HDL: 36 mg/dL — ABNORMAL LOW (ref >40)
LDL Cholesterol: 74 mg/dL (ref 0–99)
Triglycerides: 129 mg/dL (ref <150)
VLDL: 26 mg/dL (ref 0–40)

## 2019-01-04 LAB — PHOSPHORUS: Phosphorus: 3.3 mg/dL (ref 2.5–4.6)

## 2019-01-04 LAB — HEMOGLOBIN A1C
Hgb A1c MFr Bld: 5.6 % (ref 4.8–5.6)
Mean Plasma Glucose: 114.02 mg/dL

## 2019-01-04 MED ORDER — DOCUSATE SODIUM 50 MG/5ML PO LIQD
100.0000 mg | Freq: Every day | ORAL | Status: DC
  Administered 2019-01-04 – 2019-01-12 (×7): 100 mg via ORAL
  Filled 2019-01-04 (×9): qty 10

## 2019-01-04 MED ORDER — BISACODYL 10 MG RE SUPP: 10.0000 mg | Freq: Every day | RECTAL | Status: DC | PRN

## 2019-01-04 MED ORDER — SENNOSIDES 8.8 MG/5ML PO SYRP
10.0000 mL | ORAL_SOLUTION | Freq: Two times a day (BID) | ORAL | Status: DC
  Administered 2019-01-04 – 2019-01-13 (×13): 10 mL via ORAL
  Filled 2019-01-04 (×14): qty 10

## 2019-01-04 MED ORDER — MIDAZOLAM HCL 2 MG/2ML IJ SOLN
2.0000 mg | Freq: Once | INTRAMUSCULAR | Status: AC
  Administered 2019-01-04: 2 mg via INTRAVENOUS

## 2019-01-04 NOTE — Progress Notes (Signed)
Pt was noted to have increased coughing and restlessness.  of Fentanyl given at 0450  Approx 0500 pt was noted to be having seizure like activity including but not limited clamping down on her ETT.  RT and this RN at bedside.  Pt was blocking airway and O2 sats were noted as low as 54%.  With great force, RT was able to bag pt and increase O2 sat back to 90s.  2mg  versed given.  [facility] button pushed in room and this RN was advised the doctor would get back with me. After approx 1-2 minutes seizure like activity ceased.  Approx 0515 received call back from Dr. Darrick Penna from [facility] advising to contact Neurology.  Text page to Neurology with return call from Dr. Wilford Corner.  Dr. Wilford Corner to review EEG.    Will continue to monitor closely.

## 2019-01-04 NOTE — Procedures (Signed)
CPT/Type of Study: 95720; 24hr EEG with video Recording Date: 01/03/2019 15:00 - 01/04/2019 07:30 Interpreting physician: Sherri Rad, DO  History: This is a 58 year old patient with history of altered mental status after cardiac arrest, undergoing an EEG to evaluate for seizures.   Technical Description: The EEG was performed using standard setting per the guidelines of American Clinical Neurophysiology Society (ACNS).   A minimum of 21 electrodes were placed on scalp according to the International 10-20 or/and 10-10 Systems. Supplemental electrodes were placed as needed. Single EKG electrode was also used to detect cardiac arrhythmia. Patient's behavior was continuously recorded on video simultaneously with EEG. A minimum of 16 channels were used for data display. Each epoch of study was reviewed manually daily and as needed using standard referential and bipolar montages. Computerized quantitative EEG analysis (such as compressed spectral array analysis, trending, automated spike & seizure detection) were used as indicated.   Clinical State: Stupor Background: The posterior dominant rhythm was recorded up to 8Hz , rarely seen Overall Amplitude: Normal Predominant Frequency: Delta and theta rhythms Asymmetry: No Sleep background: Stage II  Abnormalities: Continuous slow generalized Rhythmic or periodic pattern: Generalized rhythmic delta  Epileptiform activity: No Electrographic Seizure: No Events: Yes the event button was pushed several times for seizure like activity seen when coughing or clamping down on endotracheal tube. Rhythmic delta was seen around those times with no epileptiform discharges.   Breach rhythm: No Reactivity: Yes  Stimulation procedures:  Hyperventilation: Not done Photic stimulation: Not done  Impression: This EEG shows evidence of a moderate diffuse encephalopathy. No epileptiform discharges or EEG seizures were recorded. Event button pushes were not  associated with EEG seizures.

## 2019-01-04 NOTE — Progress Notes (Addendum)
PULMONARY / CRITICAL CARE MEDICINE   NAME:  Rita Santos, MRN:  [id number], DOB:  12/29/1960, LOS: 4 ADMISSION DATE:  01/14/2019, CONSULTATION DATE:  01/23/2019 REFERRING MD:  Dr. Jeraldine LootsLockwood, ER, CHIEF COMPLAINT:  Respiratory failure  BRIEF HISTORY:    58 yo female smoker works as a caregiver felt unwell on 3/07, went to sleep and was not able to be woken up on 3/08.  Received bystander CPR for about 10 minutes and then 2 minutes by fire department before ROSC.  Received narcan in field w/o response.  Intubated in ER.    HISTORY OF PRESENT ILLNESS:  History from ER staff and patient's coworker.  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  Received bystander CPR for about 10 minutes and then 2 minutes by fire department before ROSC.  Received narcan in field w/o response.  Intubated in ER.    [place]  She works as a Engineer, structuralcaregiver for an elderly couple, and one of them has a trach and is bed ridden.  No recent exposures to anyone with respiratory infection.  Patient reported that she ate something funny on 3/07 and wasn't feeling well.  Instead of going home she went to sleep at home of the family she cares for.  She apparently told them that she felt like she would wake up after going to sleep.  She went to bed around 9 pm on 3/07.  In am of 3/08 she was snoring, and wouldn't wake up.  The family called another caregiver who arrived around 1230 pm.  She immediately recognized something was wrong, had them call 911 and started CPR.  She did this for about 10 minutes and then fire department did another 2 minutes of CPR.  No shock needed per AED.  She remained hypotensive and not able to protect airway.  Intubated in ER and started on epinephrine.  Police are trying to locate her family.  SIGNIFICANT PAST MEDICAL HISTORY   Bipolar, Chronic opiate medication use, HTN, Allergies,  Insomnia  SIGNIFICANT EVENTS:  3/08 Admit  STUDIES:   CT head 3/08 >> small vessel white matter disease  01/02/2019 MRI head.  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  She cortical-based infarcts.  Findings favor drug-induced vasculitis/vasospasm.  Left middle frontal gyrus cortical signal abnormality could questionably be ischemic or possibly seizure activity. 3/12 CXR- bibasilar atelectasis  3/12 EEG> continuous slow generalized activity. Moderate diffuse encephalopathy. No seizures recorded.  CULTURES:  Blood 3/08 >> Sputum 3/08 >> staph aureus>> Influenza PCR 3/08 >> Respiratory viral panel 3/08 >> Pneumococcal Ag 3/08 >> 12/2018+ MRSA screen nasopharynx ANTIBIOTICS:  Vancomycin 3/08 >>3/11 Zosyn 3/08 >> 3/11 Ancef 3/11>   LINES/TUBES:  ETT 3/08 >>   CONSULTANTS:  neuro  SUBJECTIVE:  Clamping down on ETT, diaphoretic, tachycardic while off sedation. EEG complete-- no seizures   CONSTITUTIONAL: BP (!) 148/94   Pulse 64   Temp 99 F (37.2 C) (Oral)   Resp 20   Ht 5\' 6"  (1.676 m)   Wt 87.5 kg   SpO2 100%   BMI 31.14 kg/m   I/O last 3 completed shifts: In: 5565.6 [I.V.:2701.2; NG/GT:2040; IV Piggyback:824.4] Out: 3100 [Urine:3100]     Vent Mode: PRVC FiO2 (%):  [40 %] 40 % Set Rate:  [20 bmp] 20 bmp Vt Set:  [469[470  mL] 470 mL PEEP:  [5 cmH20] 5 cmH20 Pressure Support:  [10 cmH20] 10 cmH20 Plateau Pressure:  [11 cmH20-14 cmH20] 14 cmH20  PHYSICAL EXAM:  General: WDWN critically ill appearing adult female, intubated, diaphoretic, clenched jaw HEENT: NCAT, EEG in place, pink mmm, ETT secure  Neuro: stuporous. Withdraws to noxious stimuli, does not follow commands   CV: sinus tachycardia at time of exam. s1s2 no r/g/m 2+ radial pulses PULM: Symmetrical chest expansion, trachea midline, diminished breath sounds.  GI: soft, round, ndnt, bowel sounds x4  Extremities: Symmetrical bulk and  tone.  Skin: Diaphoretic, warm, clean, intact   RESOLVED PROBLEM LIST   Shock   ASSESSMENT AND PLAN    Acute respiratory failure with hypoxia and compromised airway. -Staph Aureus Pneumonia Plan -Wean FiO2/PEEP as tolerated for SpO2  92% -Weaned well today, with 2hrs of PSV CPAP 10/5 -Daily WUA/SBT  -Neuro status precludes extubation planning, patient may require tracheostomy   -continue Ancef for Staph Aureus in tracheal aspirate  -VAP bundle   Acute encephalopathy Bilateral Watershed border zone infarcts  Hx of Bipolar, Chronic opiate use. -? EtOH abuse Plan -PRN Versed and fentanyl. Has been off continuous sedation and began clenching jaw/clamping ETT, became tachycardic and diaphoretic.  -Neurology consult is appreciated for watershed infarct  -MRI with infarcts as noted  Acute renal failure from ATN >> improved Plan -Improving renal function  -Continue to Monitor BMP  -Continue strict I/O   Elevated LFTs from hypoxia, shock. Plan -LFTs are slowly improving continue to monitor  Constipation P BID senokot QD colace QD PRN dulcolax   Best Practice / Goals of Care / Disposition.   DVT PROPHYLAXIS: heparin SQ SUP: protonix NUTRITION: NPO MOBILITY: Bed rest GOALS OF CARE: Full  FAMILY DISCUSSIONS: No family at bedside DISPOSITION: Continue ICU level of care   LABS  Glucose Recent Labs  Lab 01/03/19 1536 01/03/19 2012 01/03/19 2333 01/04/19 0359 01/04/19 0743 01/04/19 1227  GLUCAP 108* 113* 110* 119* 136* 105*    BMET Recent Labs  Lab 01/02/19 0355 01/03/19 0308 01/04/19 0538  NA 144 144 142  K 3.7 4.5 3.6  CL 116* 114* 110  CO2 22 26 24   BUN 26* 16 15  CREATININE 1.55* 1.03* 0.86  GLUCOSE 127* 103* 131*    Liver Enzymes Recent Labs  Lab 01/21/2019 1434 01/01/19 0308 01/02/19 0355  AST 146* 355* 145*  ALT 115* 289* 208*  ALKPHOS 75 55 55  BILITOT 0.6 1.1 0.7  ALBUMIN 4.2 3.4* 2.8*    Electrolytes Recent Labs  Lab  01/02/19 0355 01/02/19 1653 01/03/19 0308 01/04/19 0538  CALCIUM 8.7*  --  8.7* 8.4*  MG 2.1 1.9 1.8 1.8  PHOS 1.6* 2.7 2.4* 3.3    CBC Recent Labs  Lab 01/01/19 0640 01/02/19 0328 01/02/19 0355 01/03/19 0308  WBC 15.4*  --  10.9* 11.6*  HGB 11.8* 10.2* 11.1* 11.5*  HCT 36.3 30.0* 33.5* 34.7*  PLT 230  --  178 167    ABG Recent Labs  Lab 12/25/2018 1842 01/01/19 0437 01/02/19 0328  PHART 7.284* 7.451* 7.392  PCO2ART 47.2 28.3* 40.2  PO2ART 245* 73.0* 122.0*    Coag's Recent Labs  Lab 01/18/2019 1434 01/19/2019 1724  APTT  --  22*  INR 1.1 1.1    Sepsis Markers Recent Labs  Lab 01/15/2019 1435 01/11/2019 1724 01/07/2019 1925  LATICACIDVEN 8.4* 4.2* 3.1*  PROCALCITON  --  3.73  --     Cardiac Enzymes Recent Labs  Lab  01-30-2019 1434  TROPONINI 0.08*   Critical Care Tim 40 minutes Tessie Fass MSN, AGACNP-BC [facility] Pulmonary/Critical Care Medicine 01/04/2019, 1:45 PM [phone]  Attending Note:  58 year old female with polysubstance abuse who presents to PCCM s/p cardiac arrest.  Patient has not woken up and remains unresponsive on exam with coarse BS diffusely.  I reviewed CXR myself, ETT is in a good position.  Discussed with PCCM-NP.  MRI consistent with watershed infarct.  Will ask husband to come in for conversation regarding code status and his wishes as patient has not regained consciousness.  RN to arrange.  PCCM will continue to follow.  The patient is critically ill with multiple organ systems failure and requires high complexity decision making for assessment and support, frequent evaluation and titration of therapies, application of advanced monitoring technologies and extensive interpretation of multiple databases.   Critical Care Time devoted to patient care services described in this note is  32  Minutes. This time reflects time of care of this signee Dr Koren Bound. This critical care time does not reflect procedure time, or teaching time or  supervisory time of PA/NP/Med student/Med Resident etc but could involve care discussion time.  Alyson Reedy, M.D. [facility] Pulmonary/Critical Care Medicine. Pager: [phone]. After hours pager: [phone].

## 2019-01-04 NOTE — Progress Notes (Signed)
EEG maint is complete. No skin breakdown. Continue to monitor 

## 2019-01-04 NOTE — Progress Notes (Signed)
STROKE TEAM PROGRESS NOTE   SUBJECTIVE (INTERVAL HISTORY) Her RN and RT are at the bedside.  Patient still intubated, on ventilation, but with diaphoresis and tachycardia.  CCM is working on vent settings.  Long-term EEG no seizure overnight.  Will DC.   OBJECTIVE Temp:  [99 F (37.2 C)-99.7 F (37.6 C)] 99.3 F (37.4 C) (03/12 1607) Pulse Rate:  [64-114] 71 (03/12 1600) Cardiac Rhythm: Normal sinus rhythm (03/12 1600) Resp:  [14-35] 20 (03/12 1600) BP: (121-181)/(72-112) 167/106 (03/12 1600) SpO2:  [98 %-100 %] 99 % (03/12 1600) FiO2 (%):  [40 %] 40 % (03/12 1533) Weight:  [87.5 kg] 87.5 kg (03/12 0500)  Recent Labs  Lab 01/03/19 2333 01/04/19 0359 01/04/19 0743 01/04/19 1227 01/04/19 1616  GLUCAP 110* 119* 136* 105* 127*   Recent Labs  Lab 2019-01-07 1434 01/01/19 0308 01/01/19 0437  01/01/19 1556 01/02/19 0328 01/02/19 0355 01/02/19 1653 01/03/19 0308 01/04/19 0538  NA 145 144 142  --   --  147* 144  --  144 142  K 4.6 4.0 3.8  --   --  3.7 3.7  --  4.5 3.6  CL 103 112*  --   --   --   --  116*  --  114* 110  CO2 21* 21*  --   --   --   --  22  --  26 24  GLUCOSE 132* 87  --   --   --   --  127*  --  103* 131*  BUN 31* 40*  --   --   --   --  26*  --  16 15  CREATININE 4.23* 3.06*  --   --   --   --  1.55*  --  1.03* 0.86  CALCIUM 8.5* 8.2*  --   --   --   --  8.7*  --  8.7* 8.4*  MG 2.6* 1.8  --    < > 2.0  --  2.1 1.9 1.8 1.8  PHOS  --  2.7  --    < > 2.3*  --  1.6* 2.7 2.4* 3.3   < > = values in this interval not displayed.   Recent Labs  Lab Jan 07, 2019 1434 01/01/19 0308 01/02/19 0355  AST 146* 355* 145*  ALT 115* 289* 208*  ALKPHOS 75 55 55  BILITOT 0.6 1.1 0.7  PROT 7.5 5.8* 5.4*  ALBUMIN 4.2 3.4* 2.8*   Recent Labs  Lab January 07, 2019 1434 01/01/19 0437 01/01/19 0640 01/02/19 0328 01/02/19 0355 01/03/19 0308  WBC 22.0*  --  15.4*  --  10.9* 11.6*  NEUTROABS 16.9*  --   --   --   --  9.1*  HGB 14.0 10.9* 11.8* 10.2* 11.1* 11.5*  HCT 47.6* 32.0*  36.3 30.0* 33.5* 34.7*  MCV 96.4  --  86.8  --  87.7 87.6  PLT 312  --  230  --  178 167   Recent Labs  Lab 07-Jan-2019 1434  TROPONINI 0.08*   No results for input(s): LABPROT, INR in the last 72 hours. No results for input(s): COLORURINE, LABSPEC, PHURINE, GLUCOSEU, HGBUR, BILIRUBINUR, KETONESUR, PROTEINUR, UROBILINOGEN, NITRITE, LEUKOCYTESUR in the last 72 hours.  Invalid input(s): APPERANCEUR     Component Value Date/Time   CHOL 136 01/04/2019 0538   TRIG 129 01/04/2019 0538   HDL 36 (L) 01/04/2019 0538   CHOLHDL 3.8 01/04/2019 0538   VLDL 26 01/04/2019 0538   LDLCALC 74 01/04/2019 0538  Lab Results  Component Value Date   HGBA1C 5.6 01/04/2019      Component Value Date/Time   LABOPIA POSITIVE (A) Jan 10, 2019 1710   COCAINSCRNUR POSITIVE (A) January 10, 2019 1710   LABBENZ NONE DETECTED 2019/01/10 1710   AMPHETMU NONE DETECTED 2019-01-10 1710   THCU NONE DETECTED January 10, 2019 1710   LABBARB NONE DETECTED 2019-01-10 1710    Recent Labs  Lab 2019/01/10 1724  ETH <10    I have personally reviewed the radiological images below and agree with the radiology interpretations.  Ct Angio Head W Or Wo Contrast  Result Date: 01/03/2019 CLINICAL DATA:  Followup extensive bilateral watershed infarctions. EXAM: CT ANGIOGRAPHY HEAD AND NECK TECHNIQUE: Multidetector CT imaging of the head and neck was performed using the standard protocol during bolus administration of intravenous contrast. Multiplanar CT image reconstructions and MIPs were obtained to evaluate the vascular anatomy. Carotid stenosis measurements (when applicable) are obtained utilizing NASCET criteria, using the distal internal carotid diameter as the denominator. CONTRAST:  62mL OMNIPAQUE IOHEXOL 350 MG/ML SOLN COMPARISON:  MRI earlier today.  CT 2019/01/10 FINDINGS: CT HEAD FINDINGS Brain: Low-density within the hemispheric deep white matter in a patchy pattern. No large vessel infarction. No hemorrhage, hydrocephalus or  extra-axial collection. Vascular: Negative by CT. Skull: Negative Sinuses: Mucosal inflammatory changes of the right maxillary sinus. Orbits: Negative Review of the MIP images confirms the above findings CTA NECK FINDINGS Aortic arch: Mild atherosclerotic change of the aorta. No sign of aneurysm or dissection. Branching pattern is normal. Left vertebral artery arises from the arch. Right carotid system: Common carotid artery widely patent to the bifurcation. Soft and calcified plaque at the bifurcation and ICA bulb. Minimal diameter of the ICA bulb 4 mm. Compared to a more distal cervical ICA diameter of 4.5 mm, this indicates a 10% stenosis. Left carotid system: Common carotid artery widely patent to the bifurcation. Calcified plaque at the ICA bulb but no stenosis. Cervical ICA is tortuous but widely patent. Vertebral arteries: Left vertebral artery arises from the arch as noted above. The vessel appears widely patent through the cervical region to the foramen magnum. The right vertebral artery is patent, but difficult to evaluate accurately because of extensive venous reflux. No origin stenosis is suspected. Skeleton: Ordinary cervical spondylosis. Other neck: No mass or lymphadenopathy. Upper chest: Mild dependent atelectasis of the upper lungs. Review of the MIP images confirms the above findings CTA HEAD FINDINGS Anterior circulation: Both internal carotid arteries patent through the skull base and siphon regions. Siphon atherosclerotic calcification but no stenosis. The anterior and middle cerebral vessels are patent without proximal stenosis, aneurysm or vascular malformation. No evidence of vasculitic irregularity. Posterior circulation: Both vertebral arteries are patent through the foramen magnum to the basilar. No basilar stenosis. Posterior circulation branch vessels are patent and normal. Venous sinuses: Patent and normal. Anatomic variants: None significant. Delayed phase: No abnormal enhancement.  Review of the MIP images confirms the above findings IMPRESSION: No evidence of vasculitis by CT angiography. No large or medium vessel occlusion or flow restricting stenosis. Atherosclerotic disease at both carotid bifurcations, right worse than left. 10% stenosis of the right ICA bulb. No stenosis on the left. Therefore, acute infarctions are most likely due to watershed/hypoperfusion infarction. Electronically Signed   By: Paulina Fusi M.D.   On: 01/03/2019 12:12   Ct Head Wo Contrast  Result Date: Jan 10, 2019 CLINICAL DATA:  Loss of consciousness, post arrest EXAM: CT HEAD WITHOUT CONTRAST TECHNIQUE: Contiguous axial images were obtained from the base  of the skull through the vertex without intravenous contrast. COMPARISON:  None. FINDINGS: Brain: No evidence of acute infarction, hemorrhage, hydrocephalus, extra-axial collection or mass lesion/mass effect. Periventricular white matter hypodensity. Vascular: No hyperdense vessel or unexpected calcification. Skull: Normal. Negative for fracture or focal lesion. Sinuses/Orbits: No acute finding. Other: None. IMPRESSION: No acute intracranial pathology.  Small-vessel white matter disease. Electronically Signed   By: Lauralyn Primes M.D.   On: 01-29-19 15:34   Ct Angio Neck W Or Wo Contrast  Result Date: 01/03/2019 CLINICAL DATA:  Followup extensive bilateral watershed infarctions. EXAM: CT ANGIOGRAPHY HEAD AND NECK TECHNIQUE: Multidetector CT imaging of the head and neck was performed using the standard protocol during bolus administration of intravenous contrast. Multiplanar CT image reconstructions and MIPs were obtained to evaluate the vascular anatomy. Carotid stenosis measurements (when applicable) are obtained utilizing NASCET criteria, using the distal internal carotid diameter as the denominator. CONTRAST:  73mL OMNIPAQUE IOHEXOL 350 MG/ML SOLN COMPARISON:  MRI earlier today.  CT 2019-01-29 FINDINGS: CT HEAD FINDINGS Brain: Low-density within the  hemispheric deep white matter in a patchy pattern. No large vessel infarction. No hemorrhage, hydrocephalus or extra-axial collection. Vascular: Negative by CT. Skull: Negative Sinuses: Mucosal inflammatory changes of the right maxillary sinus. Orbits: Negative Review of the MIP images confirms the above findings CTA NECK FINDINGS Aortic arch: Mild atherosclerotic change of the aorta. No sign of aneurysm or dissection. Branching pattern is normal. Left vertebral artery arises from the arch. Right carotid system: Common carotid artery widely patent to the bifurcation. Soft and calcified plaque at the bifurcation and ICA bulb. Minimal diameter of the ICA bulb 4 mm. Compared to a more distal cervical ICA diameter of 4.5 mm, this indicates a 10% stenosis. Left carotid system: Common carotid artery widely patent to the bifurcation. Calcified plaque at the ICA bulb but no stenosis. Cervical ICA is tortuous but widely patent. Vertebral arteries: Left vertebral artery arises from the arch as noted above. The vessel appears widely patent through the cervical region to the foramen magnum. The right vertebral artery is patent, but difficult to evaluate accurately because of extensive venous reflux. No origin stenosis is suspected. Skeleton: Ordinary cervical spondylosis. Other neck: No mass or lymphadenopathy. Upper chest: Mild dependent atelectasis of the upper lungs. Review of the MIP images confirms the above findings CTA HEAD FINDINGS Anterior circulation: Both internal carotid arteries patent through the skull base and siphon regions. Siphon atherosclerotic calcification but no stenosis. The anterior and middle cerebral vessels are patent without proximal stenosis, aneurysm or vascular malformation. No evidence of vasculitic irregularity. Posterior circulation: Both vertebral arteries are patent through the foramen magnum to the basilar. No basilar stenosis. Posterior circulation branch vessels are patent and normal.  Venous sinuses: Patent and normal. Anatomic variants: None significant. Delayed phase: No abnormal enhancement. Review of the MIP images confirms the above findings IMPRESSION: No evidence of vasculitis by CT angiography. No large or medium vessel occlusion or flow restricting stenosis. Atherosclerotic disease at both carotid bifurcations, right worse than left. 10% stenosis of the right ICA bulb. No stenosis on the left. Therefore, acute infarctions are most likely due to watershed/hypoperfusion infarction. Electronically Signed   By: Paulina Fusi M.D.   On: 01/03/2019 12:12   Mr Brain Wo Contrast  Result Date: 01/03/2019 CLINICAL DATA:  58 y/o F; found unresponsive, cardiac arrest, CPR by family and EMS, course complicated by likely aspiration pneumonia and shock, continued neurologic decline. UDS positive for cocaine and opiates. EXAM: MRI HEAD  WITHOUT CONTRAST TECHNIQUE: Multiplanar, multiecho pulse sequences of the brain and surrounding structures were obtained without intravenous contrast. COMPARISON:  2019-01-02 CT head. FINDINGS: Brain: Numerous foci of reduced diffusion are present throughout white matter of the supratentorial brain with increased T2 signal and minimal local mass effect compatible with late acute/early subacute infarctions. A few punctate foci of late acute/early subacute infarction are present in the biparietal cortices. No lesion is present within the basal ganglia or posterior fossa. Curvilinear reduced diffusion and increased T2 signal is present within a small area of cortex in the left middle frontal gyrus. Punctate foci of susceptibility hypointensity are present within the left cerebellar hemisphere, left parietal lobe, and right medial occipital lobe compatible with hemosiderin deposition of chronic microhemorrhage. Foci of chronic microhemorrhage are distinct from areas of recent infarction. No extra-axial collection, hydrocephalus, or herniation. Vascular: Normal flow voids.  Skull and upper cervical spine: Normal marrow signal. Sinuses/Orbits: Paranasal sinus mucosal thickening and mastoid effusions, likely due to intubation. Orbits are unremarkable. Other: None. IMPRESSION: 1. Numerous nonhemorrhagic late acute/early subacute infarctions throughout the supratentorial white matter, few cortical based infarct. Findings favor drug-induced vasculitis/vasospasm, a component of watershed ischemia may be present. 2. Small area of curvilinear cortical signal abnormality in left middle frontal gyrus may represent an additional focus of ischemia or possibly seizure activity. These results will be called to the ordering clinician or representative by the Radiologist Assistant, and communication documented in the PACS or zVision Dashboard. Electronically Signed   By: Mitzi Hansen M.D.   On: 01/03/2019 02:40   Long-term EEG This EEG shows evidence of a moderate diffuse encephalopathy. No epileptiform discharges or EEG seizures were recorded. Event button pushes were not associated with EEG seizures.   PHYSICAL EXAM  Temp:  [99 F (37.2 C)-99.7 F (37.6 C)] 99.3 F (37.4 C) (03/12 1607) Pulse Rate:  [64-114] 71 (03/12 1600) Resp:  [14-35] 20 (03/12 1600) BP: (121-181)/(72-112) 167/106 (03/12 1600) SpO2:  [98 %-100 %] 99 % (03/12 1600) FiO2 (%):  [40 %] 40 % (03/12 1533) Weight:  [87.5 kg] 87.5 kg (03/12 0500)  General - Well nourished, well developed, intubated not on sedation.  Ophthalmologic - fundi not visualized due to noncooperation.  Cardiovascular - Regular rhythm, tachycardia.  Neuro - intubated not on sedation, eyes closed, not following commands. With forced eye opening, eyes in mid position, not blinking to visual threat, doll's eyes present, not tracking, PERRL. Corneal reflex present bilaterally, gag and cough present.  However, with gag and cough, patient has bilateral upper extremity extension posturing.  Breathing over the vent.  Facial symmetry  not able to test due to ET tube.  Tongue midline in mouth. On pain stimulation, mild withdraw to lower extremities, no withdraw on upper extremities. DTR 1+ and bilateral babinski. Sensation, coordination and gait not tested.   ASSESSMENT/PLAN Ms. Rita Santos is a 58 y.o. female with history of bipolar disorder and hypertension admitted for cardio arrest with AKI, leukocytosis, respiratory failure, metabolic acidosis.  Initially improving mental status, but then had mental status decline.  MRI showed watershed infarcts bilaterally.  No tPA given due to outside window.    Stroke:  bilateral watershed border zone infarcts, consistent with hypoperfusion secondary to cardio arrest   Resultant intubated, nonresponsive  CT no acute abnormality  MRI bilateral watershed border zone infarcts  CT head and neck right ICA bulb atherosclerosis, no LVO, no mycotic aneurysm or vasculitis  2D Echo EF 60 to 65%  LDL 74  HgbA1c  5.6  UDS positive for cocaine and opiates  Heparin subq for VTE prophylaxis  NPO  No antithrombotic prior to admission, now on aspirin 81 mg daily.   Ongoing aggressive stroke risk factor management  Therapy recommendations:  Pending   Disposition:  Pending   Seizure-like activity  Episodes of bilateral upper extremity posturing  Concern for seizure activity  Spot EEG generalized slowing no seizure  long-term EEG no seizure  No AEDs needed at this time  Fever and leukocytosis  T-max 101.1->100.2-afebrile  Leukocytosis WBC 22.0-15.4-10.9-11.6  Blood culture negative so far  Sputum culture pansensitive staph aureus  On Ancef  Continue monitoring  Respiratory failure  Intubated on ventilation  CCM on board  Mental status not appropriate for extubation  Continue supportive care  Cocaine abuse  UDS showed positive for cocaine and opiates  Concerning for substance overdose  Supportive care  Cocaine cessation education will be  provided later  Tobacco abuse  Current smoker  Smoking cessation counseling will be provided later  Hypertension . Stable on the higher end . Permissive hypertension (OK if <180/105) for 24-48 hours post stroke and then gradually normalized within 3-5 days.  Long term BP goal normotensive  Hyperlipidemia  No statin at home  LDL 74, goal less than 70  Consider low-dose statin once more stable  Other Stroke Risk Factors  Limit ETOH use  Obesity, Body mass index is 31.14 kg/m.   Other Active Problems  AKI, creatinine 1.55-1.03-0.86  Leukocytosis WBC 22-15.4-10.9-11.6    Hospital day # 4  This patient is critically ill due to cardio arrest, watershed infarcts bilaterally, cocaine abuse, seizure-like activity, respiratory failure and at significant risk of neurological worsening, death form recurrent stroke, seizure, heart failure, sepsis. This patient's care requires constant monitoring of vital signs, hemodynamics, respiratory and cardiac monitoring, review of multiple databases, neurological assessment, discussion with family, other specialists and medical decision making of high complexity. I spent 40 minutes of neurocritical care time in the care of this patient.   Marvel Plan, MD PhD Stroke Neurology 01/04/2019 5:20 PM    To contact Stroke Continuity provider, please refer to [email or web address]. After hours, contact General Neurology

## 2019-01-05 ENCOUNTER — Inpatient Hospital Stay (HOSPITAL_COMMUNITY): Payer: BLUE CROSS/BLUE SHIELD

## 2019-01-05 DIAGNOSIS — N179 Acute kidney failure, unspecified: Secondary | ICD-10-CM

## 2019-01-05 DIAGNOSIS — G931 Anoxic brain damage, not elsewhere classified: Secondary | ICD-10-CM

## 2019-01-05 LAB — BASIC METABOLIC PANEL
ANION GAP: 7 (ref 5–15)
BUN: 18 mg/dL (ref 6–20)
CO2: 20 mmol/L — ABNORMAL LOW (ref 22–32)
Calcium: 8.2 mg/dL — ABNORMAL LOW (ref 8.9–10.3)
Chloride: 112 mmol/L — ABNORMAL HIGH (ref 98–111)
Creatinine, Ser: 0.76 mg/dL (ref 0.44–1.00)
GFR calc non Af Amer: >60 mL/min (ref >60)
Glucose, Bld: 110 mg/dL — ABNORMAL HIGH (ref 70–99)
Potassium: 4.7 mmol/L (ref 3.5–5.1)
Sodium: 139 mmol/L (ref 135–145)

## 2019-01-05 LAB — CULTURE, BLOOD (ROUTINE X 2)
Culture: NO GROWTH
Culture: NO GROWTH
Special Requests: ADEQUATE

## 2019-01-05 LAB — GLUCOSE, CAPILLARY
Glucose-Capillary: 103 mg/dL — ABNORMAL HIGH (ref 70–99)
Glucose-Capillary: 120 mg/dL — ABNORMAL HIGH (ref 70–99)
Glucose-Capillary: 110 mg/dL — ABNORMAL HIGH (ref 70–99)
Glucose-Capillary: 123 mg/dL — ABNORMAL HIGH (ref 70–99)
Glucose-Capillary: 118 mg/dL — ABNORMAL HIGH (ref 70–99)

## 2019-01-05 MED ORDER — ATORVASTATIN CALCIUM 10 MG PO TABS
10.0000 mg | ORAL_TABLET | Freq: Every day | ORAL | Status: DC
  Administered 2019-01-05 – 2019-01-14 (×10): 10 mg via ORAL
  Filled 2019-01-05 (×10): qty 1

## 2019-01-05 MED ORDER — LABETALOL HCL 200 MG PO TABS
200.0000 mg | ORAL_TABLET | Freq: Three times a day (TID) | ORAL | Status: DC
  Administered 2019-01-05 – 2019-01-14 (×27): 200 mg via ORAL
  Filled 2019-01-05 (×27): qty 1

## 2019-01-05 MED ORDER — FUROSEMIDE 10 MG/ML IJ SOLN
40.0000 mg | Freq: Three times a day (TID) | INTRAMUSCULAR | Status: AC
  Administered 2019-01-05 (×2): 40 mg via INTRAVENOUS
  Filled 2019-01-05 (×2): qty 4

## 2019-01-05 NOTE — Progress Notes (Signed)
PULMONARY / CRITICAL CARE MEDICINE   NAME:  Rita Santos, MRN:  [id number], DOB:  11/13/1960, LOS: 5 ADMISSION DATE:  12/30/2018, CONSULTATION DATE:  01/06/2019 REFERRING MD:  Dr. 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  Received bystander CPR for about 10 minutes and then 2 minutes by fire department before ROSC.  Received narcan in field w/o response.  Intubated in ER.    HISTORY OF PRESENT ILLNESS:  History from ER staff and patient's coworker.  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  Received bystander CPR for about 10 minutes and then 2 minutes by fire department before ROSC.  Received narcan in field w/o response.  Intubated in ER.    She lives with her elderly parents in [place].  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  No recent exposures to anyone with respiratory infection.  Patient reported that she ate something funny on 3/07 and wasn't feeling well.  Instead of going home she went to sleep at home of the family she cares for.  She apparently told them that she felt like she would wake up after going to sleep.  She went to bed around 9 pm on 3/07.  In am of 3/08 she was snoring, and wouldn't wake up.  The family called another caregiver who arrived around 1230 pm.  She immediately recognized something was wrong, had them call 911 and started CPR.  She did this for about 10 minutes and then fire department did another 2 minutes of CPR.  No shock needed per AED.  She remained hypotensive and not able to protect airway.  Intubated in ER and started on epinephrine.  Police are trying to locate her family.  SIGNIFICANT PAST MEDICAL HISTORY   Bipolar, Chronic opiate medication use, HTN, Allergies,  Insomnia  SIGNIFICANT EVENTS:  3/08 Admit  STUDIES:   CT head 3/08 >> small vessel white matter disease  01/02/2019 MRI head.  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  She cortical-based infarcts.  Findings favor drug-induced vasculitis/vasospasm.  Left middle frontal gyrus cortical signal abnormality could questionably be ischemic or possibly seizure activity. 3/12 CXR- bibasilar atelectasis  3/12 EEG> continuous slow generalized activity. Moderate diffuse encephalopathy. No seizures recorded.  CULTURES:  Blood 3/08 >> Sputum 3/08 >> staph aureus>> Influenza PCR 3/08 >> Respiratory viral panel 3/08 >> Pneumococcal Ag 3/08 >> 12/2018+ MRSA screen nasopharynx ANTIBIOTICS:  Vancomycin 3/08 >>3/11 Zosyn 3/08 >> 3/11 Ancef 3/11>   LINES/TUBES:  ETT 3/08 >>   CONSULTANTS:  neuro  SUBJECTIVE:  Tachycardia and HTN overnight and fentanyl was used to control  CONSTITUTIONAL: BP (!) 152/78   Pulse 90   Temp 98.9 F (37.2 C) (Oral)   Resp (!) 26   Ht 5\' 6"  (1.676 m)   Wt 88.2 kg   SpO2 99%   BMI 31.38 kg/m   I/O last 3 completed shifts: In: 6156.6 [I.V.:3076.7; Other:270; NG/GT:2160; IV Piggyback:650] Out: 3535 [Urine:3535]     Vent Mode: PRVC FiO2 (%):  [40 %] 40 % Set Rate:  [20 bmp] 20 bmp Vt Set:  [470 mL] 470  mL PEEP:  [5 cmH20] 5 cmH20 Plateau Pressure:  [14 cmH20-18 cmH20] 18 cmH20  PHYSICAL EXAM:  General: Acutely ill appearing female, NAD, unresponsive HEENT: [place]/AT, PERRL, EOM-I and MMM Neuro: Grimace to noxious stimuli but no other interaction CV: RRR, Nl S1/S2 and -M/R/G PULM: Coarse BS diffusely GI: Soft, NT, ND and +BS Extremities: Symmetrical bulk and tone.  Skin: Diaphoretic, warm, clean, intact   RESOLVED PROBLEM LIST   Shock   ASSESSMENT AND PLAN    Acute respiratory failure with hypoxia and compromised airway. -Staph Aureus Pneumonia Plan - Begin PS trials but no  extubation given mental status - Daily WUA/SBT  - Neuro status precludes extubation planning, patient may require tracheostomy, spoke with family about that yesterday, they are currently discussing it and will likely have an answer today - Continue Ancef for Staph Aureus in tracheal aspirate  - VAP bundle   Acute encephalopathy Bilateral Watershed border zone infarcts  Hx of Bipolar, Chronic opiate use. -? EtOH abuse Plan - PRN Versed and fentanyl. Has been off continuous sedation and began clenching jaw/clamping ETT, became tachycardic and diaphoretic.  - Neurology consult is appreciated for watershed infarct  - MRI with infarcts as noted  Acute renal failure from ATN >> improved Plan - Improving renal function  - Continue to Monitor BMP  - Continue strict I/O  - 2 doses of 40 of lasix ordered - KVO IVF  Elevated LFTs from hypoxia, shock. Plan - LFTs are slowly improving continue to monitor  Constipation P BID senokot QD colace QD PRN dulcolax   Need to decide on trach/peg, awaiting response for family today.  Best Practice / Goals of Care / Disposition.   DVT PROPHYLAXIS: heparin SQ SUP: protonix NUTRITION: NPO MOBILITY: Bed rest GOALS OF CARE: Full  FAMILY DISCUSSIONS: No family at bedside DISPOSITION: Continue ICU level of care   LABS  Glucose Recent Labs  Lab 01/04/19 1227 01/04/19 1616 01/04/19 1941 01/04/19 2332 01/05/19 0355 01/05/19 0744  GLUCAP 105* 127* 103* 111* 103* 120*    BMET Recent Labs  Lab 01/03/19 0308 01/04/19 0538 01/05/19 0258  NA 144 142 139  K 4.5 3.6 4.7  CL 114* 110 112*  CO2 26 24 20*  BUN CREATININE 1.03* 0.86 0.76  GLUCOSE 103* 131* 110*    Liver Enzymes Recent Labs  Lab 2019-01-06 1434 01/01/19 0308 01/02/19 0355  AST 146* 355* 145*  ALT 115* 289* 208*  ALKPHOS 75 55 55  BILITOT 0.6 1.1 0.7  ALBUMIN 4.2 3.4* 2.8*    Electrolytes Recent Labs  Lab 01/02/19 1653 01/03/19 0308 01/04/19 0538  01/05/19 0258  CALCIUM  --  8.7* 8.4* 8.2*  MG 1.9 1.8 1.8  --   PHOS 2.7 2.4* 3.3  --     CBC Recent Labs  Lab 01/02/19 0355 01/03/19 0308 01/04/19 1744  WBC 10.9* 11.6* 8.6  HGB 11.1* 11.5* 11.9*  HCT 33.5* 34.7* 36.9  PLT 178 167 176    ABG Recent Labs  Lab 01-06-19 1842 01/01/19 0437 01/02/19 0328  PHART 7.284* 7.451* 7.392  PCO2ART 47.2 28.3* 40.2  PO2ART 245* 73.0* 122.0*    Coag's Recent Labs  Lab 2019-01-06 1434 01-06-2019 1724  APTT  --  22*  INR 1.1 1.1    Sepsis Markers Recent Labs  Lab January 06, 2019 1435 06-Jan-2019 1724 2019-01-06 1925  LATICACIDVEN 8.4* 4.2* 3.1*  PROCALCITON  --  3.73  --     Cardiac Enzymes Recent Labs  Lab Jan 12, 2019 1434  TROPONINI 0.08*   The patient is critically ill with multiple organ systems failure and requires high complexity decision making for assessment and support, frequent evaluation and titration of therapies, application of advanced monitoring technologies and extensive interpretation of multiple databases.   Critical Care Time devoted to patient care services described in this note is  33  Minutes. This time reflects time of care of this signee Dr Koren Bound. This critical care time does not reflect procedure time, or teaching time or supervisory time of PA/NP/Med student/Med Resident etc but could involve care discussion time.  Alyson Reedy, M.D. [facility] Pulmonary/Critical Care Medicine. Pager: [phone]. After hours pager: [phone].

## 2019-01-05 NOTE — Progress Notes (Signed)
STROKE TEAM PROGRESS NOTE   SUBJECTIVE (INTERVAL HISTORY) Her RN is at the bedside.  Patient still intubated, on ventilation, with episodic tachycardia and tachypnea, needing intermittent sedation with versed and fentanyl. No neuro changes, still not responsive, not open eyes, but intact brainstem reflexes and withdraw to pain.   OBJECTIVE Temp:  [98 F (36.7 C)-99.7 F (37.6 C)] 98 F (36.7 C) (03/13 0415) Pulse Rate:  [64-111] 107 (03/13 0725) Cardiac Rhythm: Normal sinus rhythm (03/12 2000) Resp:  [14-36] 36 (03/13 0725) BP: (107-181)/(69-112) 168/91 (03/13 0725) SpO2:  [98 %-100 %] 100 % (03/13 0725) FiO2 (%):  [40 %] 40 % (03/13 0725) Weight:  [88.2 kg] 88.2 kg (03/13 0255)  Recent Labs  Lab 01/04/19 1227 01/04/19 1616 01/04/19 1941 01/04/19 2332 01/05/19 0355  GLUCAP 105* 127* 103* 111* 103*   Recent Labs  Lab 01/01/19 0308  01/01/19 1556 01/02/19 0328 01/02/19 0355 01/02/19 1653 01/03/19 0308 01/04/19 0538 01/05/19 0258  NA 144   < >  --  147* 144  --  144 142 139  K 4.0   < >  --  3.7 3.7  --  4.5 3.6 4.7  CL 112*  --   --   --  116*  --  114* 110 112*  CO2 21*  --   --   --  22  --  26 24 20*  GLUCOSE 87  --   --   --  127*  --  103* 131* 110*  BUN 40*  --   --   --  26*  --  CREATININE 3.06*  --   --   --  1.55*  --  1.03* 0.86 0.76  CALCIUM 8.2*  --   --   --  8.7*  --  8.7* 8.4* 8.2*  MG 1.8   < > 2.0  --  2.1 1.9 1.8 1.8  --   PHOS 2.7   < > 2.3*  --  1.6* 2.7 2.4* 3.3  --    < > = values in this interval not displayed.   Recent Labs  Lab 2019/01/02 1434 01/01/19 0308 01/02/19 0355  AST 146* 355* 145*  ALT 115* 289* 208*  ALKPHOS 75 55 55  BILITOT 0.6 1.1 0.7  PROT 7.5 5.8* 5.4*  ALBUMIN 4.2 3.4* 2.8*   Recent Labs  Lab 2019-01-02 1434  01/01/19 0640 01/02/19 0328 01/02/19 0355 01/03/19 0308 01/04/19 1744  WBC 22.0*  --  15.4*  --  10.9* 11.6* 8.6  NEUTROABS 16.9*  --   --   --   --  9.1*  --   HGB 14.0   < > 11.8* 10.2* 11.1*  11.5* 11.9*  HCT 47.6*   < > 36.3 30.0* 33.5* 34.7* 36.9  MCV 96.4  --  86.8  --  87.7 87.6 87.6  PLT 312  --  230  --  178 167 176   < > = values in this interval not displayed.   Recent Labs  Lab 01-02-19 1434  TROPONINI 0.08*   No results for input(s): LABPROT, INR in the last 72 hours. No results for input(s): COLORURINE, LABSPEC, PHURINE, GLUCOSEU, HGBUR, BILIRUBINUR, KETONESUR, PROTEINUR, UROBILINOGEN, NITRITE, LEUKOCYTESUR in the last 72 hours.  Invalid input(s): APPERANCEUR     Component Value Date/Time   CHOL 136 01/04/2019 0538   TRIG 129 01/04/2019 0538   HDL 36 (L) 01/04/2019 0538   CHOLHDL 3.8 01/04/2019 0538   VLDL 26  01/04/2019 0538   LDLCALC 74 01/04/2019 0538   Lab Results  Component Value Date   HGBA1C 5.6 01/04/2019      Component Value Date/Time   LABOPIA POSITIVE (A) 01/01/2019 1710   COCAINSCRNUR POSITIVE (A) 12/30/2018 1710   LABBENZ NONE DETECTED 01/02/2019 1710   AMPHETMU NONE DETECTED 01/22/2019 1710   THCU NONE DETECTED 12/25/2018 1710   LABBARB NONE DETECTED 01/06/2019 1710    Recent Labs  Lab 01/21/2019 1724  ETH <10    I have personally reviewed the radiological images below and agree with the radiology interpretations.  Ct Angio Head W Or Wo Contrast  Result Date: 01/03/2019 CLINICAL DATA:  Followup extensive bilateral watershed infarctions. EXAM: CT ANGIOGRAPHY HEAD AND NECK TECHNIQUE: Multidetector CT imaging of the head and neck was performed using the standard protocol during bolus administration of intravenous contrast. Multiplanar CT image reconstructions and MIPs were obtained to evaluate the vascular anatomy. Carotid stenosis measurements (when applicable) are obtained utilizing NASCET criteria, using the distal internal carotid diameter as the denominator. CONTRAST:  75mL OMNIPAQUE IOHEXOL 350 MG/ML SOLN COMPARISON:  MRI earlier today.  CT 12/30/2018 FINDINGS: CT HEAD FINDINGS Brain: Low-density within the hemispheric deep white  matter in a patchy pattern. No large vessel infarction. No hemorrhage, hydrocephalus or extra-axial collection. Vascular: Negative by CT. Skull: Negative Sinuses: Mucosal inflammatory changes of the right maxillary sinus. Orbits: Negative Review of the MIP images confirms the above findings CTA NECK FINDINGS Aortic arch: Mild atherosclerotic change of the aorta. No sign of aneurysm or dissection. Branching pattern is normal. Left vertebral artery arises from the arch. Right carotid system: Common carotid artery widely patent to the bifurcation. Soft and calcified plaque at the bifurcation and ICA bulb. Minimal diameter of the ICA bulb 4 mm. Compared to a more distal cervical ICA diameter of 4.5 mm, this indicates a 10% stenosis. Left carotid system: Common carotid artery widely patent to the bifurcation. Calcified plaque at the ICA bulb but no stenosis. Cervical ICA is tortuous but widely patent. Vertebral arteries: Left vertebral artery arises from the arch as noted above. The vessel appears widely patent through the cervical region to the foramen magnum. The right vertebral artery is patent, but difficult to evaluate accurately because of extensive venous reflux. No origin stenosis is suspected. Skeleton: Ordinary cervical spondylosis. Other neck: No mass or lymphadenopathy. Upper chest: Mild dependent atelectasis of the upper lungs. Review of the MIP images confirms the above findings CTA HEAD FINDINGS Anterior circulation: Both internal carotid arteries patent through the skull base and siphon regions. Siphon atherosclerotic calcification but no stenosis. The anterior and middle cerebral vessels are patent without proximal stenosis, aneurysm or vascular malformation. No evidence of vasculitic irregularity. Posterior circulation: Both vertebral arteries are patent through the foramen magnum to the basilar. No basilar stenosis. Posterior circulation branch vessels are patent and normal. Venous sinuses: Patent and  normal. Anatomic variants: None significant. Delayed phase: No abnormal enhancement. Review of the MIP images confirms the above findings IMPRESSION: No evidence of vasculitis by CT angiography. No large or medium vessel occlusion or flow restricting stenosis. Atherosclerotic disease at both carotid bifurcations, right worse than left. 10% stenosis of the right ICA bulb. No stenosis on the left. Therefore, acute infarctions are most likely due to watershed/hypoperfusion infarction. Electronically Signed   By: Paulina Fusi M.D.   On: 01/03/2019 12:12   Ct Head Wo Contrast  Result Date: 01/18/2019 CLINICAL DATA:  Loss of consciousness, post arrest EXAM: CT HEAD WITHOUT  CONTRAST TECHNIQUE: Contiguous axial images were obtained from the base of the skull through the vertex without intravenous contrast. COMPARISON:  None. FINDINGS: Brain: No evidence of acute infarction, hemorrhage, hydrocephalus, extra-axial collection or mass lesion/mass effect. Periventricular white matter hypodensity. Vascular: No hyperdense vessel or unexpected calcification. Skull: Normal. Negative for fracture or focal lesion. Sinuses/Orbits: No acute finding. Other: None. IMPRESSION: No acute intracranial pathology.  Small-vessel white matter disease. Electronically Signed   By: Alex  Bibbey M.D.   On: 12/25/2018 15:34   Ct Angio Neck W Or Wo Contrast  Result Date: 01/03/2019 CLINICAL DATA:  Followup extensive bilateral watershed infarctions. EXAM: CT ANGIOGRAPHY HEAD AND NECK TECHNIQUE: Multidetector CT imaging of the head and neck was performed using the standard protocol during bolus administration of intravenous contrast. Multiplanar CT image reconstructions and MIPs were obtained to evaluate the vascular anatomy. Carotid stenosis measurements (when applicable) are obtained utilizing [phone] BFred161Judie GrieHendrick MedicHackettstown Regional [phone] BFred161Judie GrieSouth Arlington Surgica Providers Inc Dba Same Day Cascade [phone] BFred161Judie GrieSwedish Medical Center - Cherry [phone] BFred161Judie GrieEl Paso Ltac [facility] CMeda [phone] BFred161Judie GrieCommunity Hospital Of Anderson And MadisUmass Memorial Medical [facility] University [phone] BFred161Judie GrieOceans Behavioral Hospital OfHills & Dales General [phone] BFred161Judie GrieWellstar Atlanta MedicP H S Indian Hosp [facility] Bel[facility]-Quentin N [phone] BFred161Judie GrieOdessa Endoscopy CHshs St Clare Memorial [phone] BFred161Judie GrieEndoscopy Center Of Western NewGeisinger -Lewistown HospitaThe [phone] BFred161Judie GrieNaval Health Clinic [facility],Summer[place] [phone] BFred161Judie GrieSaint Josephs Hospital And MedicBayne-Jones [phone] BFred161Judie GrieEastonLincoln [phone] BFred161Judie GrieScripps GreenEncompass Health Rehabilitation Hospital Of Rock HilStaten [phone] BFred161Judie GrieW. G. (Bill) Hefner Va MedicSaint ALPhonsus Eagle Health Plz-[phone] BFred161Judie GrieEisenhower Army MedicNew England Laser And [phone] BFred161Judie GrieWellstar North FultonAssurance Health [phone] BFred161Judie GrieTempleton EndoscoMuenster Memorial [phone] BFred161Judie GrieRehabiliation Hospital Of [phone] BFred161Judie GrieSagewest Advocate [phone] BFred161Judie GrieEastern La Mental HealDouglas County Memorial HospitaMarion Hospital [phone] BFred161Judie GrieMcalester Ambulatory Surgery CYork HospitaCommunity [phone] BFred161Judie GrieThe Center For Plastic And ReconstructivCrook County Medical Services [phone] BFred161Judie GrieCentracare Health SyVa Medical Center - [phone] BFred161Judie GrieSaint Mary'S HeMercy Regional Medical CenteKindred Meda CofEnergy Transfer ParKentuckytn4Holton Community HospitaDoristine Bosworthainlaware Countyy Transfer ParKentuckytn4V[facility] Hospital Of RichardsoDorist3m AD FINDINGS Brain: Low-density within the hemispheric deep white matter in a patchy pattern. No large vessel infarction. No hemorrhage, hydrocephalus or extra-axial collection. Vascular: Negative by CT. Skull: Negative Sinuses: Mucosal inflammatory changes of the right maxillary sinus. Orbits: Negative Review of the MIP images confirms the above findings CTA NECK FINDINGS Aortic arch: Mild atherosclerotic change of the aorta. No sign of aneurysm or dissection. Branching pattern is normal. Left vertebral artery arises from the arch. Right carotid system: Common carotid artery widely patent to the bifurcation. Soft and calcified plaque at the bifurcation and ICA bulb. Minimal diameter of the ICA bulb 4 mm. Compared to a more distal cervical ICA diameter of 4.5 mm, this indicates a 10% stenosis. Left carotid system: Common carotid artery widely patent to the bifurcation. Calcified plaque at the ICA bulb but no stenosis. Cervical ICA is tortuous but widely patent. Vertebral arteries: Left vertebral artery arises from the arch as noted above. The vessel appears widely patent through the cervical region to the foramen magnum. The right vertebral artery is patent, but difficult to evaluate accurately because of extensive venous reflux. No origin stenosis is suspected. Skeleton: Ordinary cervical spondylosis. Other neck: No mass or lymphadenopathy. Upper chest: Mild dependent atelectasis of the upper lungs. Review of the MIP images confirms the above findings CTA HEAD FINDINGS Anterior circulation: Both internal carotid arteries patent through the skull base and siphon regions. Siphon atherosclerotic calcification but no stenosis. The anterior and middle cerebral vessels are patent without proximal stenosis, aneurysm or vascular malformation. No evidence of vasculitic irregularity. Posterior circulation: Both vertebral arteries are patent through the foramen magnum to the basilar. No  basilar stenosis. Posterior circulation branch vessels are patent and normal. Venous sinuses: Patent and normal. Anatomic variants: None significant. Delayed phase: No abnormal enhancement. Review of the MIP images confirms the above findings IMPRESSION: No evidence of vasculitis by CT angiography. No large or medium vessel occlusion or flow restricting stenosis. Atherosclerotic disease at both carotid bifurcations, right worse than left. 10% stenosis of the right ICA bulb. No stenosis on the left. Therefore, acute infarctions are most likely due to watershed/hypoperfusion infarction. Electronically Signed   By: Mark  Shogry M.D.   On: 01/03/2019 12:12   Mr Brain Wo Contrast  Result Date: 01/03/2019 CLINICAL DATA:  57 y/o F; found unresponsive, cardiac arrest, CPR by family and EMS, course complicated by likely aspiration pneumonia and shock, continued neurologic  decline. UDS positive for cocaine and opiates. EXAM: MRI HEAD WITHOUT CONTRAST TECHNIQUE: Multiplanar, multiecho pulse sequences of the brain and surrounding structures were obtained without intravenous contrast. COMPARISON:  12/30/2018 CT head. FINDINGS: Brain: Numerous foci of reduced diffusion are present throughout white matter of the supratentorial brain with increased T2 signal and minimal local mass effect compatible with late acute/early subacute infarctions. A few punctate foci of late acute/early subacute infarction are present in the biparietal cortices. No lesion is present within the basal ganglia or posterior fossa. Curvilinear reduced diffusion and increased T2 signal is present within a small area of cortex in the left middle frontal gyrus. Punctate foci of susceptibility hypointensity are present within the left cerebellar hemisphere, left parietal lobe, and right medial occipital lobe compatible with hemosiderin deposition of chronic microhemorrhage. Foci of chronic microhemorrhage are distinct from areas of recent infarction. No  extra-axial collection, hydrocephalus, or herniation. Vascular: Normal flow voids. Skull and upper cervical spine: Normal marrow signal. Sinuses/Orbits: Paranasal sinus mucosal thickening and mastoid effusions, likely due to intubation. Orbits are unremarkable. Other: None. IMPRESSION: 1. Numerous nonhemorrhagic late acute/early subacute infarctions throughout the supratentorial white matter, few cortical based infarct. Findings favor drug-induced vasculitis/vasospasm, a component of watershed ischemia may be present. 2. Small area of curvilinear cortical signal abnormality in left middle frontal gyrus may represent an additional focus of ischemia or possibly seizure activity. These results will be called to the ordering clinician or representative by the Radiologist Assistant, and communication documented in the PACS or zVision Dashboard. Electronically Signed   By: Mitzi Hansen M.D.   On: 01/03/2019 02:40   Long-term EEG This EEG shows evidence of a moderate diffuse encephalopathy. No epileptiform discharges or EEG seizures were recorded. Event button pushes were not associated with EEG seizures.   PHYSICAL EXAM  Temp:  [98 F (36.7 C)-99.7 F (37.6 C)] 98 F (36.7 C) (03/13 0415) Pulse Rate:  [64-111] 107 (03/13 0725) Resp:  [14-36] 36 (03/13 0725) BP: (107-181)/(69-112) 168/91 (03/13 0725) SpO2:  [98 %-100 %] 100 % (03/13 0725) FiO2 (%):  [40 %] 40 % (03/13 0725) Weight:  [88.2 kg] 88.2 kg (03/13 0255)  General - Well nourished, well developed, intubated on intermittent sedation.  Ophthalmologic - fundi not visualized due to noncooperation.  Cardiovascular - Regular rhythm, tachycardia.  Neuro - intubated on intermittent sedation, eyes closed, not following commands. With forced eye opening, eyes in mid position, not blinking to visual threat, doll's eyes present, not tracking, PERRL. Corneal reflex present bilaterally, gag and cough present.  Breathing over the vent.  Facial  symmetry not able to test due to ET tube.  Tongue midline in mouth. On pain stimulation, trace withdraw with BUEs, and mild withdraw to BLEs. DTR 1+ and bilateral babinski. Sensation, coordination and gait not tested.   ASSESSMENT/PLAN Rita Santos is a 58 y.o. female with history of bipolar disorder and hypertension admitted for cardio arrest with AKI, leukocytosis, respiratory failure, metabolic acidosis.  Initially improving mental status, but then had mental status decline.  MRI showed watershed infarcts bilaterally.  No tPA given due to outside window.    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   Ongoing aggressive stroke risk factor management  Therapy recommendations:  Pending   Disposition:  Pending  Seizure-like activity  Episodes of bilateral upper extremity posturing  Concern for seizure activity  Spot EEG generalized slowing no seizure  long-term EEG no seizure  No AEDs needed at this time  Fever and leukocytosis, resolved  T-max 101.1->100.2-afebrile  Leukocytosis WBC 22.0-15.4-10.9-11.6-8.6  Blood culture negative so far  Sputum culture pansensitive staph aureus  On Ancef  Continue monitoring  Respiratory failure  Intubated on ventilation  CCM on board  Mental status not appropriate for extubation  Continue supportive care  Cocaine abuse  UDS showed positive for cocaine and opiates  Concerning for substance overdose  Supportive care  Cocaine cessation education will be  provided later  Tobacco abuse  Current smoker  Smoking cessation counseling will be provided later  Hypertension . Stable on the higher end . Permissive hypertension (OK if <180/105) for 24-48 hours post stroke and then gradually normalized within 3-5 days.  Long term BP goal normotensive  Hyperlipidemia  No statin at home  LDL 74, goal less than 70  On low dose lipitor 10  Other Stroke Risk Factors  Limit ETOH use  Obesity, Body mass index is 31.38 kg/m.   Other Active Problems  AKI, creatinine 1.55-1.03-0.86-0.76  Leukocytosis WBC 22-15.4-10.9-11.6-8.6    Hospital day # 5  Neurology has no additional recs at this time, we will sign off. Please call with questions. Thanks for the consult.  Marvel Plan, MD PhD Stroke Neurology 01/05/2019 10:35 AM     To contact Stroke Continuity provider, please refer to [email or web address]. After hours, contact General Neurology

## 2019-01-05 NOTE — Progress Notes (Signed)
Upon entering room, pts flexi had come out, partially solid stool in bed.  Removed flexi for now.  NG tube was also noted to have slipped out several inches. NG advanced and abd xray ordered to ensure correct placement.    Pt has been consistently tachypnic in 40s (now 50-60s) past 20 min, despite suctioning and repositioning. Spoke w/  RT and pt switched back to [facility] mode.

## 2019-01-06 ENCOUNTER — Inpatient Hospital Stay (HOSPITAL_COMMUNITY): Payer: BLUE CROSS/BLUE SHIELD

## 2019-01-06 LAB — POCT I-STAT 7, (LYTES, BLD GAS, ICA,H+H)
ACID-BASE EXCESS: 5 mmol/L — AB (ref 0.0–2.0)
Bicarbonate: 29.1 mmol/L — ABNORMAL HIGH (ref 20.0–28.0)
Calcium, Ion: 1.25 mmol/L (ref 1.15–1.40)
HCT: 33 % — ABNORMAL LOW (ref 36.0–46.0)
Hemoglobin: 11.2 g/dL — ABNORMAL LOW (ref 12.0–15.0)
O2 SAT: 99 %
Patient temperature: 99.1
Potassium: 3.3 mmol/L — ABNORMAL LOW (ref 3.5–5.1)
Sodium: 143 mmol/L (ref 135–145)
TCO2: 30 mmol/L (ref 22–32)
pCO2 arterial: 40.7 mmHg (ref 32.0–48.0)
pH, Arterial: 7.462 — ABNORMAL HIGH (ref 7.350–7.450)
pO2, Arterial: 123 mmHg — ABNORMAL HIGH (ref 83.0–108.0)

## 2019-01-06 LAB — CBC
HCT: 36.4 % (ref 36.0–46.0)
Hemoglobin: 12 g/dL (ref 12.0–15.0)
MCH: 28.9 pg (ref 26.0–34.0)
MCHC: 33 g/dL (ref 30.0–36.0)
MCV: 87.7 fL (ref 80.0–100.0)
PLATELETS: 204 10*3/uL (ref 150–400)
RBC: 4.15 MIL/uL (ref 3.87–5.11)
RDW: 14.3 % (ref 11.5–15.5)
WBC: 10.5 10*3/uL (ref 4.0–10.5)
nRBC: 0 % (ref 0.0–0.2)

## 2019-01-06 LAB — BASIC METABOLIC PANEL
Anion gap: 11 (ref 5–15)
BUN: 23 mg/dL — AB (ref 6–20)
CO2: 25 mmol/L (ref 22–32)
Calcium: 9.4 mg/dL (ref 8.9–10.3)
Chloride: 107 mmol/L (ref 98–111)
Creatinine, Ser: 0.88 mg/dL (ref 0.44–1.00)
GFR calc Af Amer: >60 mL/min (ref >60)
Glucose, Bld: 139 mg/dL — ABNORMAL HIGH (ref 70–99)
POTASSIUM: 3.3 mmol/L — AB (ref 3.5–5.1)
Sodium: 143 mmol/L (ref 135–145)

## 2019-01-06 LAB — GLUCOSE, CAPILLARY
Glucose-Capillary: 116 mg/dL — ABNORMAL HIGH (ref 70–99)
Glucose-Capillary: 117 mg/dL — ABNORMAL HIGH (ref 70–99)
Glucose-Capillary: 123 mg/dL — ABNORMAL HIGH (ref 70–99)
Glucose-Capillary: 123 mg/dL — ABNORMAL HIGH (ref 70–99)
Glucose-Capillary: 124 mg/dL — ABNORMAL HIGH (ref 70–99)
Glucose-Capillary: 139 mg/dL — ABNORMAL HIGH (ref 70–99)

## 2019-01-06 LAB — PHOSPHORUS: Phosphorus: 3.1 mg/dL (ref 2.5–4.6)

## 2019-01-06 LAB — MAGNESIUM: Magnesium: 2.2 mg/dL (ref 1.7–2.4)

## 2019-01-06 LAB — TRIGLYCERIDES: Triglycerides: 130 mg/dL (ref <150)

## 2019-01-06 MED ORDER — FUROSEMIDE 10 MG/ML IJ SOLN
40.0000 mg | Freq: Three times a day (TID) | INTRAMUSCULAR | Status: AC
  Administered 2019-01-06 (×2): 40 mg via INTRAVENOUS
  Filled 2019-01-06 (×2): qty 4

## 2019-01-06 MED ORDER — LABETALOL HCL 5 MG/ML IV SOLN: 10.0000 mg | INTRAVENOUS | Status: DC | PRN

## 2019-01-06 MED ORDER — POTASSIUM CHLORIDE 20 MEQ/15ML (10%) PO SOLN
40.0000 meq | Freq: Three times a day (TID) | ORAL | Status: AC
  Administered 2019-01-06 (×2): 40 meq
  Filled 2019-01-06 (×2): qty 30

## 2019-01-06 MED ORDER — LABETALOL HCL 5 MG/ML IV SOLN
10.0000 mg | INTRAVENOUS | Status: AC | PRN
  Administered 2019-01-06 – 2019-01-13 (×10): 10 mg via INTRAVENOUS
  Filled 2019-01-06 (×12): qty 4

## 2019-01-06 MED ORDER — POTASSIUM CHLORIDE 20 MEQ/15ML (10%) PO SOLN
20.0000 meq | ORAL | Status: AC
  Administered 2019-01-06: 20 meq
  Filled 2019-01-06: qty 15

## 2019-01-06 NOTE — Progress Notes (Signed)
eLink Physician-Brief Progress Note Patient Name: Rita Santos DOB: Jul 05, 1961 MRN: [id number]   Date of Service  01/06/2019  HPI/Events of Note  Informed of hypertension with SBP in the 200s.   57/F with substance abuse, intubated after unwitnessed arrest.  eICU Interventions  Labetalol IV prn ordered.  Provide adequate pain control.      Intervention Category Major Interventions: Hypertension - evaluation and management  Larinda Buttery 01/06/2019, 2:58 AM

## 2019-01-06 NOTE — Progress Notes (Signed)
PULMONARY / CRITICAL CARE MEDICINE   NAME:  Rita Santos, MRN:  [id number], DOB:  Jan 25, 1961, LOS: 6 ADMISSION DATE:  2019/01/06, CONSULTATION DATE:  01/06/19 REFERRING MD:  Dr. 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  Received bystander CPR for about 10 minutes and then 2 minutes by fire department before ROSC.  Received narcan in field w/o response.  Intubated in ER.    HISTORY OF PRESENT ILLNESS:  History from ER staff and patient's coworker.  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  Received bystander CPR for about 10 minutes and then 2 minutes by fire department before ROSC.  Received narcan in field w/o response.  Intubated in ER.    She lives with her elderly parents in [place].  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  No recent exposures to anyone with respiratory infection.  Patient reported that she ate something funny on 3/07 and wasn't feeling well.  Instead of going home she went to sleep at home of the family she cares for.  She apparently told them that she felt like she would wake up after going to sleep.  She went to bed around 9 pm on 3/07.  In am of 3/08 she was snoring, and wouldn't wake up.  The family called another caregiver who arrived around 1230 pm.  She immediately recognized something was wrong, had them call 911 and started CPR.  She did this for about 10 minutes and then fire department did another 2 minutes of CPR.  No shock needed per AED.  She remained hypotensive and not able to protect airway.  Intubated in ER and started on epinephrine.  Police are trying to locate her family.  SIGNIFICANT PAST MEDICAL HISTORY   Bipolar, Chronic opiate medication use, HTN, Allergies,  Insomnia  SIGNIFICANT EVENTS:  3/08 Admit  STUDIES:   CT head 3/08 >> small vessel white matter disease  01/02/2019 MRI head.  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  She cortical-based infarcts.  Findings favor drug-induced vasculitis/vasospasm.  Left middle frontal gyrus cortical signal abnormality could questionably be ischemic or possibly seizure activity. 3/12 CXR- bibasilar atelectasis  3/12 EEG> continuous slow generalized activity. Moderate diffuse encephalopathy. No seizures recorded.  CULTURES:  Blood 3/08 >> Sputum 3/08 >> staph aureus>> Influenza PCR 3/08 >> Respiratory viral panel 3/08 >> Pneumococcal Ag 3/08 >> 12/2018+ MRSA screen nasopharynx ANTIBIOTICS:  Vancomycin 3/08 >>3/11 Zosyn 3/08 >> 3/11 Ancef 3/11>   LINES/TUBES:  ETT 3/08 >>   CONSULTANTS:  neuro  SUBJECTIVE:  No events overnight, no new complaints  CONSTITUTIONAL: BP (!) 162/79   Pulse 87   Temp 99.4 F (37.4 C) (Axillary)   Resp (!) 25   Ht 5\' 6"  (1.676 m)   Wt 82 kg   SpO2 98%   BMI 29.18 kg/m   I/O last 3 completed shifts: In: 5144.1 [I.V.:2323.7; Other:270; NG/GT:2150; IV Piggyback:400.3] Out: 4736 [Urine:4735; Stool:1]     Vent Mode: PRVC FiO2 (%):  [40 %] 40 % Set Rate:  [20 bmp] 20 bmp Vt Set:  [470 mL] 470 mL PEEP:  [  5 cmH20] 5 cmH20 Pressure Support:  [10 cmH20] 10 cmH20 Plateau Pressure:  [13 cmH20-15 cmH20] 13 cmH20  PHYSICAL EXAM:  General: Acutely ill appearing female, NAD, unresponsive HEENT: [place]/AT, PERRL, EOM-I and MMM Neuro: Grimace to pain but no response otherwise CV: RRR, Nl S1/S2 and -M/R/G PULM: Coarse BS diffusely GI: Soft, NT, ND and +BS Extremities: Symmetrical bulk and tone.  Skin: Diaphoretic, warm, clean, intact   I reviewed CXR myself, trach is in a good position  RESOLVED PROBLEM LIST   Shock   ASSESSMENT AND PLAN    Acute respiratory failure with hypoxia and  compromised airway. -Staph Aureus Pneumonia Plan - PS as able but no extubation given mental status - Daily WUA/SBT  - Neuro status precludes extubation planning, family ok with trach but not right now, will likely be next week - Continue Ancef for Staph Aureus in tracheal aspirate  - VAP bundle   Acute encephalopathy Bilateral Watershed border zone infarcts  Hx of Bipolar, Chronic opiate use. -? EtOH abuse Plan - PRN Versed and fentanyl. Has been off continuous sedation and began clenching jaw/clamping ETT, became tachycardic and diaphoretic.  - Neurology consult is appreciated for watershed infarct  - MRI with infarcts as noted  Acute renal failure from ATN >> improved Plan - Improving renal function  - BMET in AM - Strict I/O - Lasix 40 mg IV x2 doses - KVO IVF - K replacement PO  Elevated LFTs from hypoxia, shock. Plan - LFTs are slowly improving continue to monitor  Constipation P BID senokot QD colace QD PRN dulcolax   Need to decide on trach/peg, awaiting response for family today.  Best Practice / Goals of Care / Disposition.   DVT PROPHYLAXIS: heparin SQ SUP: protonix NUTRITION: NPO MOBILITY: Bed rest GOALS OF CARE: Full  FAMILY DISCUSSIONS: No family at bedside DISPOSITION: Continue ICU level of care   LABS  Glucose Recent Labs  Lab 01/05/19 1113 01/05/19 1544 01/05/19 2040 01/06/19 0045 01/06/19 0427 01/06/19 0818  GLUCAP 110* 123* 118* 124* 123* 116*    BMET Recent Labs  Lab 01/04/19 0538 01/05/19 0258 01/06/19 0315 01/06/19 0420  NA 142 139 143 143  K 3.6 4.7 3.3* 3.3*  CL 110 112*  --  107  CO2 24 20*  --  25  BUN 15 18  --  23*  CREATININE 0.86 0.76  --  0.88  GLUCOSE 131* 110*  --  139*    Liver Enzymes Recent Labs  Lab Jan 27, 2019 1434 01/01/19 0308 01/02/19 0355  AST 146* 355* 145*  ALT 115* 289* 208*  ALKPHOS 75 55 55  BILITOT 0.6 1.1 0.7  ALBUMIN 4.2 3.4* 2.8*    Electrolytes Recent Labs  Lab 01/03/19 0308  01/04/19 0538 01/05/19 0258 01/06/19 0420  CALCIUM 8.7* 8.4* 8.2* 9.4  MG 1.8 1.8  --  2.2  PHOS 2.4* 3.3  --  3.1    CBC Recent Labs  Lab 01/03/19 0308 01/04/19 1744 01/06/19 0315 01/06/19 0420  WBC 11.6* 8.6  --  10.5  HGB 11.5* 11.9* 11.2* 12.0  HCT 34.7* 36.9 33.0* 36.4  PLT 167 176  --  204    ABG Recent Labs  Lab 01/01/19 0437 01/02/19 0328 01/06/19 0315  PHART 7.451* 7.392 7.462*  PCO2ART 28.3* 40.2 40.7  PO2ART 73.0* 122.0* 123.0*    Coag's Recent Labs  Lab 2019-01-27 1434 01/27/19 1724  APTT  --  22*  INR 1.1 1.1   Sepsis Markers Recent Labs  Lab 11-03-18 1435 11-03-18 1724 11-03-18 1925  LATICACIDVEN 8.4* 4.2* 3.1*  PROCALCITON  --  3.73  --    Cardiac Enzymes Recent Labs  Lab 11-03-18 1434  TROPONINI 0.08*   The patient is critically ill with multiple organ systems failure and requires high complexity decision making for assessment and support, frequent evaluation and titration of therapies, application of advanced monitoring technologies and extensive interpretation of multiple databases.   Critical Care Time devoted to patient care services described in this note is  33  Minutes. This time reflects time of care of this signee Dr Koren BoundWesam Yacoub. This critical care time does not reflect procedure time, or teaching time or supervisory time of PA/NP/Med student/Med Resident etc but could involve care discussion time.  Alyson ReedyWesam G. Yacoub, M.D. [facility] Pulmonary/Critical Care Medicine. Pager: [phone]. After hours pager: [phone].

## 2019-01-06 NOTE — Progress Notes (Signed)
[facility] ADULT ICU REPLACEMENT PROTOCOL FOR AM LAB REPLACEMENT ONLY  The patient does apply for the [facility] Adult ICU Electrolyte Replacment Protocol based on the criteria listed below:   1. Is GFR >/= 40 ml/min? Yes.    Patient's GFR today is >60 2. Is urine output >/= 0.5 ml/kg/hr for the last 6 hours? Yes.   Patient's UOP is .7 ml/kg/hr 3. Is BUN < 60 mg/dL? Yes.    Patient's BUN today is 23 4. Abnormal electrolyte(s): K-3.3 5. Ordered repletion with: per protocol 6. If a panic level lab has been reported, has the CCM MD in charge been notified? Yes.  .   Physician:  Dr. Marc Morgans, [place] Boos 01/06/2019 6:25 AM

## 2019-01-07 ENCOUNTER — Inpatient Hospital Stay (HOSPITAL_COMMUNITY): Payer: BLUE CROSS/BLUE SHIELD

## 2019-01-07 LAB — GLUCOSE, CAPILLARY
GLUCOSE-CAPILLARY: 135 mg/dL — AB (ref 70–99)
GLUCOSE-CAPILLARY: 115 mg/dL — AB (ref 70–99)
Glucose-Capillary: 106 mg/dL — ABNORMAL HIGH (ref 70–99)
Glucose-Capillary: 118 mg/dL — ABNORMAL HIGH (ref 70–99)
Glucose-Capillary: 118 mg/dL — ABNORMAL HIGH (ref 70–99)
Glucose-Capillary: 123 mg/dL — ABNORMAL HIGH (ref 70–99)

## 2019-01-07 LAB — POCT I-STAT 7, (LYTES, BLD GAS, ICA,H+H)
Acid-Base Excess: 3 mmol/L — ABNORMAL HIGH (ref 0.0–2.0)
Bicarbonate: 27.4 mmol/L (ref 20.0–28.0)
Calcium, Ion: 1.26 mmol/L (ref 1.15–1.40)
HCT: 32 % — ABNORMAL LOW (ref 36.0–46.0)
Hemoglobin: 10.9 g/dL — ABNORMAL LOW (ref 12.0–15.0)
O2 Saturation: 99 %
Patient temperature: 100.3
Potassium: 3.9 mmol/L (ref 3.5–5.1)
Sodium: 143 mmol/L (ref 135–145)
TCO2: 29 mmol/L (ref 22–32)
pCO2 arterial: 42.8 mmHg (ref 32.0–48.0)
pH, Arterial: 7.418 (ref 7.350–7.450)
pO2, Arterial: 118 mmHg — ABNORMAL HIGH (ref 83.0–108.0)

## 2019-01-07 LAB — BASIC METABOLIC PANEL
Anion gap: 11 (ref 5–15)
BUN: 25 mg/dL — ABNORMAL HIGH (ref 6–20)
CALCIUM: 8.8 mg/dL — AB (ref 8.9–10.3)
CO2: 23 mmol/L (ref 22–32)
Chloride: 108 mmol/L (ref 98–111)
Creatinine, Ser: 0.88 mg/dL (ref 0.44–1.00)
GFR calc Af Amer: >60 mL/min (ref >60)
GFR calc non Af Amer: >60 mL/min (ref >60)
Glucose, Bld: 127 mg/dL — ABNORMAL HIGH (ref 70–99)
Potassium: 3.7 mmol/L (ref 3.5–5.1)
SODIUM: 142 mmol/L (ref 135–145)

## 2019-01-07 LAB — MAGNESIUM: Magnesium: 2.1 mg/dL (ref 1.7–2.4)

## 2019-01-07 LAB — CBC
HCT: 35.2 % — ABNORMAL LOW (ref 36.0–46.0)
Hemoglobin: 11.4 g/dL — ABNORMAL LOW (ref 12.0–15.0)
MCH: 29 pg (ref 26.0–34.0)
MCHC: 32.4 g/dL (ref 30.0–36.0)
MCV: 89.6 fL (ref 80.0–100.0)
Platelets: 195 10*3/uL (ref 150–400)
RBC: 3.93 MIL/uL (ref 3.87–5.11)
RDW: 14.6 % (ref 11.5–15.5)
WBC: 11.7 10*3/uL — ABNORMAL HIGH (ref 4.0–10.5)
nRBC: 0 % (ref 0.0–0.2)

## 2019-01-07 LAB — PHOSPHORUS: Phosphorus: 2.9 mg/dL (ref 2.5–4.6)

## 2019-01-07 MED ORDER — FUROSEMIDE 10 MG/ML IJ SOLN
40.0000 mg | Freq: Three times a day (TID) | INTRAMUSCULAR | Status: AC
  Administered 2019-01-07 (×2): 40 mg via INTRAVENOUS
  Filled 2019-01-07 (×2): qty 4

## 2019-01-07 MED ORDER — POTASSIUM CHLORIDE 20 MEQ/15ML (10%) PO SOLN
40.0000 meq | Freq: Three times a day (TID) | ORAL | Status: AC
  Administered 2019-01-07 (×2): 40 meq
  Filled 2019-01-07 (×2): qty 30

## 2019-01-07 NOTE — Plan of Care (Signed)
  Problem: Education: Goal: Knowledge of disease or condition will improve Outcome: Not Progressing   Problem: Activity: Goal: Ability to tolerate increased activity will improve Outcome: Not Progressing   Problem: Respiratory: Goal: Ability to maintain a clear airway and adequate ventilation will improve Outcome: Not Progressing

## 2019-01-07 NOTE — Progress Notes (Signed)
PULMONARY / CRITICAL CARE MEDICINE   NAME:  Rita Santos, MRN:  [id number], DOB:  [birth year], LOS: 7 ADMISSION DATE:  12/28/2018, CONSULTATION DATE:  12/25/2018 REFERRING MD:  Dr. 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  Received bystander CPR for about 10 minutes and then 2 minutes by fire department before ROSC.  Received narcan in field w/o response.  Intubated in ER.    HISTORY OF PRESENT ILLNESS:  History from ER staff and patient's coworker.  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  Received bystander CPR for about 10 minutes and then 2 minutes by fire department before ROSC.  Received narcan in field w/o response.  Intubated in ER.    She lives with her elderly parents in [place].  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  No recent exposures to anyone with respiratory infection.  Patient reported that she ate something funny on 3/07 and wasn't feeling well.  Instead of going home she went to sleep at home of the family she cares for.  She apparently told them that she felt like she would wake up after going to sleep.  She went to bed around 9 pm on 3/07.  In am of 3/08 she was snoring, and wouldn't wake up.  The family called another caregiver who arrived around 1230 pm.  She immediately recognized something was wrong, had them call 911 and started CPR.  She did this for about 10 minutes and then fire department did another 2 minutes of CPR.  No shock needed per AED.  She remained hypotensive and not able to protect airway.  Intubated in ER and started on epinephrine.  Police are trying to locate her family.  SIGNIFICANT PAST MEDICAL HISTORY   Bipolar, Chronic opiate medication use, HTN, Allergies,  Insomnia  SIGNIFICANT EVENTS:  3/08 Admit  STUDIES:   CT head 3/08 >> small vessel white matter disease  01/02/2019 MRI head.  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  She cortical-based infarcts.  Findings favor drug-induced vasculitis/vasospasm.  Left middle frontal gyrus cortical signal abnormality could questionably be ischemic or possibly seizure activity. 3/12 CXR- bibasilar atelectasis  3/12 EEG> continuous slow generalized activity. Moderate diffuse encephalopathy. No seizures recorded.  CULTURES:  Blood 3/08 >> Sputum 3/08 >> staph aureus>> Influenza PCR 3/08 >> Respiratory viral panel 3/08 >> Pneumococcal Ag 3/08 >> 12/2018+ MRSA screen nasopharynx ANTIBIOTICS:  Vancomycin 3/08 >>3/11 Zosyn 3/08 >> 3/11 Ancef 3/11>   LINES/TUBES:  ETT 3/08 >>   CONSULTANTS:  neuro  SUBJECTIVE:  No events overnight, no new complaints  CONSTITUTIONAL: BP (!) 107/59   Pulse 70   Temp 99.6 F (37.6 C) (Axillary)   Resp 20   Ht 5\' 6"  (1.676 m)   Wt 83 kg   SpO2 99%   BMI 29.53 kg/m   I/O last 3 completed shifts: In: 5030.8 [I.V.:1967.7; NG/GT:2580; IV Piggyback:483.1] Out: 4326 [Urine:4025; Stool:301]     Vent Mode: PSV;CPAP FiO2 (%):  [40 %] 40 % Set Rate:  [20 bmp] 20 bmp Vt Set:  [470 mL] 470 mL PEEP:  [5 cmH20]  5 cmH20 Pressure Support:  [5 cmH20] 5 cmH20 Plateau Pressure:  [10 cmH20-14 cmH20] 10 cmH20  PHYSICAL EXAM:  General: Acutely ill appearing female, NAD, unresponsive HEENT: [place]/AT, PERRL, EOM-I and MMM Neuro: Grimaces to pain with sternal rub but no other responses CV: RRR, Nl S1/S2 and -M/R/G PULM: CTA bilaterally GI: Soft, NT, ND and +BS Extremities: Symmetrical bulk and tone.  Skin: Intact  I reviewed CXR myself, ETT is in a good position  RESOLVED PROBLEM LIST   Shock   ASSESSMENT AND PLAN    Acute respiratory failure with hypoxia and compromised airway. -Staph Aureus  Pneumonia Plan - PS trials but no extubation given mental status - Daily WUA/SBT  - Discussed trach with husband but he would like to wait til next week - Continue Ancef for Staph Aureus in tracheal aspirate  - VAP bundle  Acute encephalopathy Bilateral Watershed border zone infarcts  Hx of Bipolar, Chronic opiate use. -? EtOH abuse Plan - Minimize sedation as able  - Neurology consult is appreciated for watershed infarct and neurology signed off - MRI with infarcts as noted  Acute renal failure from ATN >> improved Plan - Replace electrolytes as indicated - BMET in AM - Strict I/O - Lasix 40 mg IV x2 doses - KVO IVF - K replacement PO  Elevated LFTs from hypoxia, shock. Plan - LFTs are slowly improving continue to monitor  Constipation P BID senokot QD colace QD PRN dulcolax   Husband called, ok with trach but wants to wait til next week.  Best Practice / Goals of Care / Disposition.   DVT PROPHYLAXIS: heparin SQ SUP: protonix NUTRITION: NPO MOBILITY: Bed rest GOALS OF CARE: Full  FAMILY DISCUSSIONS: No family at bedside DISPOSITION: Continue ICU level of care   LABS  Glucose Recent Labs  Lab 01/06/19 1645 01/06/19 1957 01/07/19 0012 01/07/19 0337 01/07/19 0739 01/07/19 1127  GLUCAP 123* 117* 106* 118* 123* 118*    BMET Recent Labs  Lab 01/05/19 0258  01/06/19 0420 01/07/19 0333 01/07/19 0450  NA 139   < > 143 142 143  K 4.7   < > 3.3* 3.7 3.9  CL 112*  --  107 108  --   CO2 20*  --  25 23  --   BUN 18  --  23* 25*  --   CREATININE 0.76  --  0.88 0.88  --   GLUCOSE 110*  --  139* 127*  --    < > = values in this interval not displayed.    Liver Enzymes Recent Labs  Lab 2019/01/26 1434 01/01/19 0308 01/02/19 0355  AST 146* 355* 145*  ALT 115* 289* 208*  ALKPHOS 75 55 55  BILITOT 0.6 1.1 0.7  ALBUMIN 4.2 3.4* 2.8*    Electrolytes Recent Labs  Lab 01/04/19 0538 01/05/19 0258 01/06/19 0420 01/07/19 0333  CALCIUM 8.4* 8.2* 9.4  8.8*  MG 1.8  --  2.2 2.1  PHOS 3.3  --  3.1 2.9    CBC Recent Labs  Lab 01/04/19 1744  01/06/19 0420 01/07/19 0333 01/07/19 0450  WBC 8.6  --  10.5 11.7*  --   HGB 11.9*   < > 12.0 11.4* 10.9*  HCT 36.9   < > 36.4 35.2* 32.0*  PLT 176  --  204 195  --    < > = values in this interval not displayed.    ABG Recent Labs  Lab 01/02/19 0328 01/06/19 0315 01/07/19 0450  PHART  7.392 7.462* 7.418  PCO2ART 40.2 40.7 42.8  PO2ART 122.0* 123.0* 118.0*    Coag's Recent Labs  Lab 01/16/2019 1434 01/10/2019 1724  APTT  --  22*  INR 1.1 1.1   Sepsis Markers Recent Labs  Lab 01/07/2019 1435 01/22/2019 1724 01/12/2019 1925  LATICACIDVEN 8.4* 4.2* 3.1*  PROCALCITON  --  3.73  --    Cardiac Enzymes Recent Labs  Lab 01/09/2019 1434  TROPONINI 0.08*   The patient is critically ill with multiple organ systems failure and requires high complexity decision making for assessment and support, frequent evaluation and titration of therapies, application of advanced monitoring technologies and extensive interpretation of multiple databases.   Critical Care Time devoted to patient care services described in this note is  32  Minutes. This time reflects time of care of this signee Dr Koren Bound. This critical care time does not reflect procedure time, or teaching time or supervisory time of PA/NP/Med student/Med Resident etc but could involve care discussion time.  Alyson Reedy, M.D. [facility] Pulmonary/Critical Care Medicine. Pager: [phone]. After hours pager: [phone].

## 2019-01-08 ENCOUNTER — Inpatient Hospital Stay (HOSPITAL_COMMUNITY): Payer: BLUE CROSS/BLUE SHIELD

## 2019-01-08 DIAGNOSIS — I639 Cerebral infarction, unspecified: Secondary | ICD-10-CM

## 2019-01-08 LAB — POCT I-STAT 7, (LYTES, BLD GAS, ICA,H+H)
Acid-Base Excess: 4 mmol/L — ABNORMAL HIGH (ref 0.0–2.0)
Bicarbonate: 28.4 mmol/L — ABNORMAL HIGH (ref 20.0–28.0)
CALCIUM ION: 1.24 mmol/L (ref 1.15–1.40)
HCT: 34 % — ABNORMAL LOW (ref 36.0–46.0)
Hemoglobin: 11.6 g/dL — ABNORMAL LOW (ref 12.0–15.0)
O2 SAT: 98 %
Patient temperature: 100.4
Potassium: 4.3 mmol/L (ref 3.5–5.1)
SODIUM: 143 mmol/L (ref 135–145)
TCO2: 30 mmol/L (ref 22–32)
pCO2 arterial: 41.2 mmHg (ref 32.0–48.0)
pH, Arterial: 7.45 (ref 7.350–7.450)
pO2, Arterial: 99 mmHg (ref 83.0–108.0)

## 2019-01-08 LAB — CBC
HCT: 36.4 % (ref 36.0–46.0)
Hemoglobin: 11.7 g/dL — ABNORMAL LOW (ref 12.0–15.0)
MCH: 28.7 pg (ref 26.0–34.0)
MCHC: 32.1 g/dL (ref 30.0–36.0)
MCV: 89.4 fL (ref 80.0–100.0)
Platelets: 249 10*3/uL (ref 150–400)
RBC: 4.07 MIL/uL (ref 3.87–5.11)
RDW: 14.6 % (ref 11.5–15.5)
WBC: 14 10*3/uL — ABNORMAL HIGH (ref 4.0–10.5)
nRBC: 0 % (ref 0.0–0.2)

## 2019-01-08 LAB — GLUCOSE, CAPILLARY
Glucose-Capillary: 102 mg/dL — ABNORMAL HIGH (ref 70–99)
Glucose-Capillary: 104 mg/dL — ABNORMAL HIGH (ref 70–99)
Glucose-Capillary: 117 mg/dL — ABNORMAL HIGH (ref 70–99)
Glucose-Capillary: 123 mg/dL — ABNORMAL HIGH (ref 70–99)
Glucose-Capillary: 131 mg/dL — ABNORMAL HIGH (ref 70–99)
Glucose-Capillary: 158 mg/dL — ABNORMAL HIGH (ref 70–99)

## 2019-01-08 LAB — BASIC METABOLIC PANEL
Anion gap: 6 (ref 5–15)
BUN: 32 mg/dL — AB (ref 6–20)
CO2: 26 mmol/L (ref 22–32)
Calcium: 9 mg/dL (ref 8.9–10.3)
Chloride: 109 mmol/L (ref 98–111)
Creatinine, Ser: 0.98 mg/dL (ref 0.44–1.00)
GFR calc Af Amer: >60 mL/min (ref >60)
GFR calc non Af Amer: >60 mL/min (ref >60)
Glucose, Bld: 135 mg/dL — ABNORMAL HIGH (ref 70–99)
Potassium: 4.1 mmol/L (ref 3.5–5.1)
Sodium: 141 mmol/L (ref 135–145)

## 2019-01-08 LAB — PHOSPHORUS: Phosphorus: 3.6 mg/dL (ref 2.5–4.6)

## 2019-01-08 LAB — MAGNESIUM: Magnesium: 2.2 mg/dL (ref 1.7–2.4)

## 2019-01-08 MED ORDER — FUROSEMIDE 10 MG/ML IJ SOLN
40.0000 mg | Freq: Every day | INTRAMUSCULAR | Status: DC
  Administered 2019-01-08: 40 mg via INTRAVENOUS
  Filled 2019-01-08: qty 4

## 2019-01-08 MED ORDER — VITAL AF 1.2 CAL PO LIQD
1000.0000 mL | ORAL | Status: DC
  Administered 2019-01-08 – 2019-01-14 (×10): 1000 mL

## 2019-01-08 NOTE — Progress Notes (Signed)
Nutrition Follow-up  DOCUMENTATION CODES:   Not applicable  INTERVENTION:   Tube Feeding: Vital AF 1.2 at 70 ml/hr Provides 2016 kcals, 126 g of protein and 1361 mL of free water Meets 100% estimated calorie and protein needs  NUTRITION DIAGNOSIS:   Inadequate oral intake related to inability to eat as evidenced by NPO status.  Being addressed via TF  GOAL:   Provide needs based on ASPEN/SCCM guidelines  Met  MONITOR:   Diet advancement, Vent status, Skin, TF tolerance, Weight trends, Labs, I & O's  REASON FOR ASSESSMENT:   Ventilator    ASSESSMENT:   Patient with PMH significant for bipolar disorder, chronic opiate use, HTN, and allergies. Found unresponsive and CPR started. Admitted with shock, concern for sepsis, and ARF from ATN.   Patient is currently intubated on ventilator support MV: 15.2 L/min Temp (24hrs), Avg:100.4 F (38 C), Min:100.1 F (37.8 C), Max:100.7 F (38.2 C)  Plan for trach sometime this week  EDW 82 kg  Tolerating Vital AF 1.2 at 60 ml/hr, Pro-Stat 30 mL daily  Labs: reviewed Meds: lasix, ss novolog    Diet Order:   Diet Order            Diet NPO time specified  Diet effective now              EDUCATION NEEDS:   Not appropriate for education at this time  Skin:  Skin Assessment: Reviewed RN Assessment  Last BM:  3/16 rectal tube   Height:   Ht Readings from Last 1 Encounters:  01/12/2019 _0  (1.676 m)    Weight:   Wt Readings from Last 1 Encounters:  01/08/19 83.2 kg    Ideal Body Weight:  59.1 kg  BMI:  Body mass index is 29.61 kg/m.  Estimated Nutritional Needs:   Kcal:  2016 kcals   Protein:  120-135 grams  Fluid:  >/= 1.8 L/day   Kerman Passey MS, RD, LDN, CNSC [phone] Pager  [phone] Weekend/On-Call Pager

## 2019-01-08 NOTE — Progress Notes (Signed)
PULMONARY / CRITICAL CARE MEDICINE   NAME:  Rita Santos, MRN:  [id number], DOB:  1961-06-26, LOS: 8 ADMISSION DATE:  2019-01-12, CONSULTATION DATE:  12-Jan-2019 REFERRING MD:  Dr. 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  Received bystander CPR for about 10 minutes and then 2 minutes by fire department before ROSC.  Received narcan in field w/o response.  Intubated in ER.    HISTORY OF PRESENT ILLNESS:  History from ER staff and patient's coworker.  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  Received bystander CPR for about 10 minutes and then 2 minutes by fire department before ROSC.  Received narcan in field w/o response.  Intubated in ER.    She lives with her elderly parents in [place].  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  No recent exposures to anyone with respiratory infection.  Patient reported that she ate something funny on 3/07 and wasn't feeling well.  Instead of going home she went to sleep at home of the family she cares for.  She apparently told them that she felt like she would wake up after going to sleep.  She went to bed around 9 pm on 3/07.  In am of 3/08 she was snoring, and wouldn't wake up.  The family called another caregiver who arrived around 1230 pm.  She immediately recognized something was wrong, had them call 911 and started CPR.  She did this for about 10 minutes and then fire department did another 2 minutes of CPR.  No shock needed per AED.  She remained hypotensive and not able to protect airway.  Intubated in ER and started on epinephrine.  Police are trying to locate her family.  SIGNIFICANT PAST MEDICAL HISTORY   Bipolar, Chronic opiate medication use, HTN, Allergies,  Insomnia  SIGNIFICANT EVENTS:  3/08 Admit  STUDIES:   CT head 3/08 >> small vessel white matter disease  01/02/2019 MRI head.  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  She cortical-based infarcts.  Findings favor drug-induced vasculitis/vasospasm.  Left middle frontal gyrus cortical signal abnormality could questionably be ischemic or possibly seizure activity. 3/12 CXR- bibasilar atelectasis  3/12 EEG> continuous slow generalized activity. Moderate diffuse encephalopathy. No seizures recorded.  CULTURES:  Blood 3/08 >> Sputum 3/08 >> staph aureus>> Influenza PCR 3/08 >> Respiratory viral panel 3/08 >> Pneumococcal Ag 3/08 >> 12/2018+ MRSA screen nasopharynx ANTIBIOTICS:  Vancomycin 3/08 >>3/11 Zosyn 3/08 >> 3/11 Ancef 3/11> 3/14  LINES/TUBES:  ETT 3/08 >>   CONSULTANTS:  Neurology stroke service (now signed off)   SUBJECTIVE:  No acute events overnight Remains intubated   CONSTITUTIONAL: BP (!) 155/76   Pulse 96   Temp (!) 100.4 F (38 C) (Axillary)   Resp (!) 30   Ht 5\' 6"  (1.676 m)   Wt 83.2 kg   SpO2 98%   BMI 29.61 kg/m   I/O last 3 completed shifts: In: 3415.2 [I.V.:1432.2; NG/GT:1800; IV Piggyback:183.1] Out: 5605 [Urine:5005; Stool:600]     Vent Mode: PRVC FiO2 (%):  [40 %] 40 % Set Rate:  [20 bmp] 20 bmp Vt Set:  [  470 mL] 470 mL PEEP:  [5 cmH20] 5 cmH20 Plateau Pressure:  [12 cmH20-27 cmH20] 15 cmH20  PHYSICAL EXAM:  General: Adult female, intubated, supine in bed, NAD  HEENT: NCAT, pink mmm, ETT OGT secure, trachea midline, no lymphadenopathy  Neuro: Opens eyes to stimuli, Does not track. PERRL. Pedal dorsiflexion to pedal noxious stimuli. Grimace to sternal rub  CV: RRR s1s2 no r/g/m, no JVD, 2+ radial pulses, capillary refill < 3 seconds  PULM: CTA bilaterally , compliant with vent, no accessory muscle recruitment  GI: soft, round, non-distended normoactive bowel sounds  x4  Extremities: Symmetrical bulk, no obvious joint deformity  Skin: Clean, dry, warm, intact without rashes   RESOLVED PROBLEM LIST   Shock   Acute Renal Failure  ASSESSMENT AND PLAN    Acute respiratory failure with hypoxia and compromised airway. -Staph Aureus Pneumonia Plan - continue WUA/SBT - continue PS/CPAP trails -No extubation given underlying neuro status -Plan is for trach sometime this week, as discussed by PCCM with husband - s/p course of ancef for staph aureus PNA - Continue VAP bundle  Bilateral Watershed border zone infarcts  -Watershed infarct pattern on MRI  Plan - Appreciate neurology's recs regarding watershed infarct, neurology has signed off  - Minimize sedation as possible, RASS goal 0 - Continue neuro checks  - atorvastatin   Bipolar disorder Substance abuse Insomnia Plan -PRN versed and fentanyl given poor neuro status at this time -If patient neuro status improves, can consider restarting home abilify and trazodone, however given patient status these medications are not appropriate additions due to sedating side effects   Acute renal failure from ATN, improved  Plan - Continue to monitor BMP for Cr/BUN - Continue to trend I/O  - No diuresis this morning   Transaminitis, improving  -Elevated LFTs from hypoxia, shock. Plan -Monitoring LFTs every few days, will check tomorrow morning   Constipation P BID senokot QD colace QD PRN dulcolax   Hypertension P TID Labetalol PRN: Labetalol if SBP > 170   Malnutrition, at risk P -continue enteral nutrition  -Unclear if PEG has been discussed with husband in conjunction with Trach  Best Practice / Goals of Care / Disposition.   Pain and Sedation: PRN fentanyl, PRN versed  DVT PROPHYLAXIS: heparin SQ SUP: protonix NUTRITION: enteral Nutrition  MOBILITY: Bed rest CODE STATUS: full code  FAMILY DISCUSSIONS: none at bedside 3/16 DISPOSITION: Continue ICU level of care   LABS   Glucose Recent Labs  Lab 01/07/19 1127 01/07/19 1634 01/07/19 1943 01/08/19 0006 01/08/19 0421 01/08/19 0814  GLUCAP 118* 135* 115* 102* 104* 117*    BMET Recent Labs  Lab 01/06/19 0420 01/07/19 0333 01/07/19 0450 01/08/19 0325 01/08/19 0412  NA 143 142 143 141 143  K 3.3* 3.7 3.9 4.1 4.3  CL 107 108  --  109  --   CO2 25 23  --  26  --   BUN 23* 25*  --  32*  --   CREATININE 0.88 0.88  --  0.98  --   GLUCOSE 139* 127*  --  135*  --     Liver Enzymes Recent Labs  Lab 01/02/19 0355  AST 145*  ALT 208*  ALKPHOS 55  BILITOT 0.7  ALBUMIN 2.8*    Electrolytes Recent Labs  Lab 01/06/19 0420 01/07/19 0333 01/08/19 0325  CALCIUM 9.4 8.8* 9.0  MG 2.2 2.1 2.2  PHOS 3.1 2.9 3.6    CBC Recent Labs  Lab 01/06/19 0420 01/07/19 0333  01/07/19 0450 01/08/19 0325 01/08/19 0412  WBC 10.5 11.7*  --  14.0*  --   HGB 12.0 11.4* 10.9* 11.7* 11.6*  HCT 36.4 35.2* 32.0* 36.4 34.0*  PLT 204 195  --  249  --     ABG Recent Labs  Lab 01/06/19 0315 01/07/19 0450 01/08/19 0412  PHART 7.462* 7.418 7.450  PCO2ART 40.7 42.8 41.2  PO2ART 123.0* 118.0* 99.0    Coag's No results for input(s): APTT, INR in the last 168 hours. Sepsis Markers No results for input(s): LATICACIDVEN, PROCALCITON, O2SATVEN in the last 168 hours. Cardiac Enzymes No results for input(s): TROPONINI, PROBNP in the last 168 hours.   Critical Care Time: 40 minutes  Tessie Fass MSN, AGACNP-BC [facility] Pulmonary/Critical Care Medicine [phone] If no answer, [phone] 01/08/2019, 9:17 AM

## 2019-01-09 ENCOUNTER — Inpatient Hospital Stay (HOSPITAL_COMMUNITY): Payer: BLUE CROSS/BLUE SHIELD

## 2019-01-09 DIAGNOSIS — Z9911 Dependence on respirator [ventilator] status: Secondary | ICD-10-CM

## 2019-01-09 LAB — CBC
HCT: 36.9 % (ref 36.0–46.0)
Hemoglobin: 11.9 g/dL — ABNORMAL LOW (ref 12.0–15.0)
MCH: 28.7 pg (ref 26.0–34.0)
MCHC: 32.2 g/dL (ref 30.0–36.0)
MCV: 88.9 fL (ref 80.0–100.0)
Platelets: 273 10*3/uL (ref 150–400)
RBC: 4.15 MIL/uL (ref 3.87–5.11)
RDW: 14.7 % (ref 11.5–15.5)
WBC: 13.1 10*3/uL — ABNORMAL HIGH (ref 4.0–10.5)
nRBC: 0 % (ref 0.0–0.2)

## 2019-01-09 LAB — HEPATIC FUNCTION PANEL
ALT: 44 U/L (ref 0–44)
AST: 37 U/L (ref 15–41)
Albumin: 3.3 g/dL — ABNORMAL LOW (ref 3.5–5.0)
Alkaline Phosphatase: 65 U/L (ref 38–126)
Bilirubin, Direct: 0.1 mg/dL (ref 0.0–0.2)
Indirect Bilirubin: 0.8 mg/dL (ref 0.3–0.9)
Total Bilirubin: 0.9 mg/dL (ref 0.3–1.2)
Total Protein: 6.7 g/dL (ref 6.5–8.1)

## 2019-01-09 LAB — BASIC METABOLIC PANEL
ANION GAP: 10 (ref 5–15)
BUN: 52 mg/dL — ABNORMAL HIGH (ref 6–20)
CALCIUM: 9.1 mg/dL (ref 8.9–10.3)
CO2: 24 mmol/L (ref 22–32)
Chloride: 107 mmol/L (ref 98–111)
Creatinine, Ser: 1.54 mg/dL — ABNORMAL HIGH (ref 0.44–1.00)
GFR calc Af Amer: 43 mL/min — ABNORMAL LOW (ref >60)
GFR calc non Af Amer: 37 mL/min — ABNORMAL LOW (ref >60)
Glucose, Bld: 133 mg/dL — ABNORMAL HIGH (ref 70–99)
Potassium: 4.3 mmol/L (ref 3.5–5.1)
Sodium: 141 mmol/L (ref 135–145)

## 2019-01-09 LAB — GLUCOSE, CAPILLARY
GLUCOSE-CAPILLARY: 163 mg/dL — AB (ref 70–99)
Glucose-Capillary: 122 mg/dL — ABNORMAL HIGH (ref 70–99)
Glucose-Capillary: 131 mg/dL — ABNORMAL HIGH (ref 70–99)
Glucose-Capillary: 142 mg/dL — ABNORMAL HIGH (ref 70–99)
Glucose-Capillary: 145 mg/dL — ABNORMAL HIGH (ref 70–99)
Glucose-Capillary: 152 mg/dL — ABNORMAL HIGH (ref 70–99)
Glucose-Capillary: 157 mg/dL — ABNORMAL HIGH (ref 70–99)

## 2019-01-09 LAB — TRIGLYCERIDES: Triglycerides: 106 mg/dL (ref <150)

## 2019-01-09 MED ORDER — MUPIROCIN 2 % EX OINT
1.0000 "application " | TOPICAL_OINTMENT | Freq: Two times a day (BID) | CUTANEOUS | Status: AC
  Administered 2019-01-09 – 2019-01-14 (×10): 1 via NASAL
  Filled 2019-01-09 (×2): qty 22

## 2019-01-09 MED ORDER — ACETAMINOPHEN 325 MG PO TABS
650.0000 mg | ORAL_TABLET | Freq: Four times a day (QID) | ORAL | Status: DC | PRN
  Administered 2019-01-09 – 2019-01-10 (×3): 650 mg via ORAL
  Filled 2019-01-09 (×3): qty 2

## 2019-01-09 MED ORDER — SODIUM CHLORIDE 0.9 % IV SOLN
INTRAVENOUS | Status: DC | PRN
  Administered 2019-01-10 – 2019-01-13 (×5): via INTRAVENOUS

## 2019-01-09 MED ORDER — CHLORHEXIDINE GLUCONATE CLOTH 2 % EX PADS
6.0000 | MEDICATED_PAD | Freq: Every day | CUTANEOUS | Status: AC
  Administered 2019-01-10 – 2019-01-14 (×4): 6 via TOPICAL

## 2019-01-09 NOTE — Progress Notes (Signed)
Palliative care:  Spoke with patient's daughter - unable to be at hospital today. Scheduled meeting with patient's daughter and patient's husband for tomorrow 3/18 at 3:45.  Thank you for this consult.  Gerlean Ren, DNP, AGNP-C Palliative Medicine Team Team Phone # [phone]  Pager # [phone]

## 2019-01-09 NOTE — Progress Notes (Signed)
RT note: patient placed back on full support ventilation due to increased respiratory rate.  Currently tolerating well.  Will continue to monitor.  

## 2019-01-09 NOTE — Progress Notes (Signed)
PULMONARY / CRITICAL CARE MEDICINE   NAME:  Rita Santos, MRN:  [id number], DOB:  1961/08/11, LOS: 9 ADMISSION DATE:  01/13/2019, CONSULTATION DATE:  01/19/2019 REFERRING MD:  Dr. 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  Received bystander CPR for about 10 minutes and then 2 minutes by fire department before ROSC.  Received narcan in field w/o response.  Intubated in ER.    HISTORY OF PRESENT ILLNESS:  History from ER staff and patient's coworker.  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  Received bystander CPR for about 10 minutes and then 2 minutes by fire department before ROSC.  Received narcan in field w/o response.  Intubated in ER.    She lives with her elderly parents in [place].  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  No recent exposures to anyone with respiratory infection.  Patient reported that she ate something funny on 3/07 and wasn't feeling well.  Instead of going home she went to sleep at home of the family she cares for.  She apparently told them that she felt like she would wake up after going to sleep.  She went to bed around 9 pm on 3/07.  In am of 3/08 she was snoring, and wouldn't wake up.  The family called another caregiver who arrived around 1230 pm.  She immediately recognized something was wrong, had them call 911 and started CPR.  She did this for about 10 minutes and then fire department did another 2 minutes of CPR.  No shock needed per AED.  She remained hypotensive and not able to protect airway.  Intubated in ER and started on epinephrine.  Police are trying to locate her family.  SIGNIFICANT PAST MEDICAL HISTORY   Bipolar, Chronic opiate medication use, HTN, Allergies,  Insomnia  SIGNIFICANT EVENTS:  3/08 Admit  STUDIES:   CT head 3/08 >> small vessel white matter disease  01/02/2019 MRI head.  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  She cortical-based infarcts.  Findings favor drug-induced vasculitis/vasospasm.  Left middle frontal gyrus cortical signal abnormality could questionably be ischemic or possibly seizure activity. 3/12 CXR- bibasilar atelectasis  3/12 EEG> continuous slow generalized activity. Moderate diffuse encephalopathy. No seizures recorded.  CULTURES:  Blood 3/08 >> Sputum 3/08 >> staph aureus>> Influenza PCR 3/08 >> Respiratory viral panel 3/08 >> Pneumococcal Ag 3/08 >> 12/2018+ MRSA screen nasopharynx ANTIBIOTICS:  Vancomycin 3/08 >>3/11 Zosyn 3/08 >> 3/11 Ancef 3/11> 3/14  LINES/TUBES:  ETT 3/08 >>   CONSULTANTS:  Neurology stroke service (now signed off)  Palliative Care Medicine (consult placed 3/16)  SUBJECTIVE:  No acute events overnight PSV/CPAP this morning No improvement in neuro status  CONSTITUTIONAL: BP (!) 177/108   Pulse 91   Temp 99.9 F (37.7 C) (Oral)   Resp (!) 21   Ht 5\' 6"  (1.676 m)   Wt 83.3 kg   SpO2 98%   BMI 29.64 kg/m   I/O last 3 completed shifts: In: 1920 [I.V.:270; NG/GT:1650] Out: 4440 [Urine:4340; Stool:100]     Vent Mode: PSV;CPAP FiO2 (%):  [40 %-50 %] 40 %  Set Rate:  [20 bmp] 20 bmp Vt Set:  [470 mL] 470 mL PEEP:  [5 cmH20] 5 cmH20 Pressure Support:  [10 cmH20] 10 cmH20 Plateau Pressure:  [13 cmH20-21 cmH20] 16 cmH20  PHYSICAL EXAM:   General: Adult, ill appearing female intubated, unresponsive in bed  HEENT: NCAT, pink mmm, upward gaze, PERRL Neuro: unresponsive, does not withdraw to pain. No posturing. CV: RRR s1s2 no r/g/m PULM: CTA bilaterally  GI: soft, round, non-distended, normoactive x4 Extremities: Symmetrical bulk and tone, no obvious joint deformity. No extremity edema Skin:   Diaphoretic. No rashes, no erythema, no edema   RESOLVED PROBLEM LIST   Shock   Acute Renal Failure  ASSESSMENT AND PLAN    Acute respiratory failure with hypoxia and compromised airway. -Staph Aureus Pneumonia - s/p course of ancef for staph aureus PNA Plan - continue VAP bundle  -continue WUA/SBT - continue PS trials as tolerated -Not a candidate for extubation given poor neuro status -Patient's husband had been spoken with previously regarding trach and had expressed interest in pursuing trach -Consulting palliative care medicine for GOC assistance given poor neuro prognosis  -CXR this morning pending -CXR tomorrow AM  Bilateral Watershed border zone infarcts  -Watershed infarct pattern on MRI  Plan - Appreciate neurology's recs regarding watershed infarct, neurology has signed off   - Minimize sedation as possible, RASS goal 0 - Continue neuro checks  - atorvastatin  -PCM consult   Bipolar disorder Substance abuse Insomnia Plan -Minimize sedating agents as possible, PRN versed and PRN fentanyl -No home antipsychotics at this time given patient's poor neuro statu  Acute Kidney Injury -Cr .98 to 1.54 Plan - Continue to monitor BMP for Cr/BUN - Continue to trend I/O  - Will increase mIVF  Transaminitis, improving  -Elevated LFTs from hypoxia, shock. Plan -Monitoring LFTs every few days, will check tomorrow morning   Constipation P BID senokot QD colace QD PRN dulcolax   Hypertension P TID Labetalol PRN: Labetalol if SBP > 170   Malnutrition, at risk P -continue enteral nutrition per RDN recommendations   Goals of Care: -Palliative Care Medicine consult placed yesterday for assistance in establishing goals of care   Best Practice  / Disposition.   Pain and Sedation: PRN fentanyl, PRN versed  DVT PROPHYLAXIS: heparin SQ SUP: protonix NUTRITION: EN  MOBILITY: Bed rest CODE STATUS: full code  FAMILY DISCUSSIONS: none at bedside  DISPOSITION:  ICU  LABS  Glucose Recent Labs  Lab 01/08/19 1129 01/08/19 1538 01/08/19 1952 01/09/19 0011 01/09/19 0346 01/09/19 0733  GLUCAP 131* 158* 123* 145* 122* 142*    BMET Recent Labs  Lab 01/07/19 0333  01/08/19 0325 01/08/19 0412 01/09/19 0337  NA 142   < > 141 143 141  K 3.7   < > 4.1 4.3 4.3  CL 108  --  109  --  107  CO2 23  --  26  --  24  BUN 25*  --  32*  --  52*  CREATININE 0.88  --  0.98  --  1.54*  GLUCOSE 127*  --  135*  --  133*   < > = values in this interval not displayed.    Liver Enzymes Recent Labs  Lab 01/09/19 0337  AST 37  ALT 44  ALKPHOS 65  BILITOT 0.9  ALBUMIN 3.3*    Electrolytes Recent Labs  Lab 01/06/19 0420 01/07/19 0333 01/08/19 0325 01/09/19 0337  CALCIUM 9.4 8.8* 9.0 9.1  MG 2.2 2.1 2.2  --  PHOS 3.1 2.9 3.6  --     CBC Recent Labs  Lab 01/07/19 0333  01/08/19 0325 01/08/19 0412 01/09/19 0337  WBC 11.7*  --  14.0*  --  13.1*  HGB 11.4*   < > 11.7* 11.6* 11.9*  HCT 35.2*   < > 36.4 34.0* 36.9  PLT 195  --  249  --  273   < > = values in this interval not displayed.    ABG Recent Labs  Lab 01/06/19 0315 01/07/19 0450 01/08/19 0412  PHART 7.462* 7.418 7.450  PCO2ART 40.7 42.8 41.2  PO2ART 123.0* 118.0* 99.0    Coag's No results for input(s): APTT, INR in the last 168 hours. Sepsis Markers No results for input(s): LATICACIDVEN, PROCALCITON, O2SATVEN in the last 168 hours. Cardiac Enzymes No results for input(s): TROPONINI, PROBNP in the last 168 hours.   Critical Care Time: 35 minutes   Tessie Fass MSN, AGACNP-BC [facility] Pulmonary/Critical Care Medicine [phone] If no answer, [phone] 01/09/2019, 8:27 AM

## 2019-01-09 NOTE — Progress Notes (Signed)
PCCM:  Brief update to family present in the room. Scheduled family meeting with palliative tomorrow. Nursing staff present for update. All questions answered.   Rita Igo, DO [facility] Critical Care 01/09/2019 3:11 PM

## 2019-01-10 ENCOUNTER — Inpatient Hospital Stay (HOSPITAL_COMMUNITY): Payer: BLUE CROSS/BLUE SHIELD

## 2019-01-10 DIAGNOSIS — Z7189 Other specified counseling: Secondary | ICD-10-CM

## 2019-01-10 DIAGNOSIS — Z515 Encounter for palliative care: Secondary | ICD-10-CM

## 2019-01-10 LAB — BASIC METABOLIC PANEL
Anion gap: 10 (ref 5–15)
BUN: 52 mg/dL — AB (ref 6–20)
CHLORIDE: 108 mmol/L (ref 98–111)
CO2: 26 mmol/L (ref 22–32)
Calcium: 9.4 mg/dL (ref 8.9–10.3)
Creatinine, Ser: 1.25 mg/dL — ABNORMAL HIGH (ref 0.44–1.00)
GFR calc Af Amer: 56 mL/min — ABNORMAL LOW (ref >60)
GFR calc non Af Amer: 48 mL/min — ABNORMAL LOW (ref >60)
Glucose, Bld: 156 mg/dL — ABNORMAL HIGH (ref 70–99)
Potassium: 4 mmol/L (ref 3.5–5.1)
Sodium: 144 mmol/L (ref 135–145)

## 2019-01-10 LAB — CBC
HCT: 39 % (ref 36.0–46.0)
HCT: 37.8 % (ref 36.0–46.0)
Hemoglobin: 12.2 g/dL (ref 12.0–15.0)
Hemoglobin: 11.8 g/dL — ABNORMAL LOW (ref 12.0–15.0)
MCH: 28.4 pg (ref 26.0–34.0)
MCH: 28.3 pg (ref 26.0–34.0)
MCHC: 31.3 g/dL (ref 30.0–36.0)
MCHC: 31.2 g/dL (ref 30.0–36.0)
MCV: 90.9 fL (ref 80.0–100.0)
MCV: 90.6 fL (ref 80.0–100.0)
NRBC: 0 % (ref 0.0–0.2)
Platelets: 287 10*3/uL (ref 150–400)
Platelets: 264 10*3/uL (ref 150–400)
RBC: 4.29 MIL/uL (ref 3.87–5.11)
RBC: 4.17 MIL/uL (ref 3.87–5.11)
RDW: 14.8 % (ref 11.5–15.5)
RDW: 14.7 % (ref 11.5–15.5)
WBC: 18.9 10*3/uL — ABNORMAL HIGH (ref 4.0–10.5)
WBC: 16.4 10*3/uL — AB (ref 4.0–10.5)
nRBC: 0 % (ref 0.0–0.2)

## 2019-01-10 LAB — GLUCOSE, CAPILLARY
GLUCOSE-CAPILLARY: 123 mg/dL — AB (ref 70–99)
Glucose-Capillary: 121 mg/dL — ABNORMAL HIGH (ref 70–99)
Glucose-Capillary: 125 mg/dL — ABNORMAL HIGH (ref 70–99)
Glucose-Capillary: 128 mg/dL — ABNORMAL HIGH (ref 70–99)
Glucose-Capillary: 131 mg/dL — ABNORMAL HIGH (ref 70–99)
Glucose-Capillary: 134 mg/dL — ABNORMAL HIGH (ref 70–99)
Glucose-Capillary: 171 mg/dL — ABNORMAL HIGH (ref 70–99)

## 2019-01-10 LAB — URINALYSIS, ROUTINE W REFLEX MICROSCOPIC
Bacteria, UA: NONE SEEN
Bilirubin Urine: NEGATIVE
Ketones, ur: NEGATIVE mg/dL
Leukocytes,Ua: NEGATIVE
Nitrite: NEGATIVE
PH: 5 (ref 5.0–8.0)
Protein, ur: 30 mg/dL — AB
RBC / HPF: >50 RBC/hpf — ABNORMAL HIGH (ref 0–5)
Specific Gravity, Urine: 1.023 (ref 1.005–1.030)

## 2019-01-10 MED ORDER — VANCOMYCIN HCL 10 G IV SOLR
1250.0000 mg | INTRAVENOUS | Status: DC
  Filled 2019-01-10: qty 1250

## 2019-01-10 MED ORDER — SODIUM CHLORIDE 0.9 % IV SOLN
2.0000 g | Freq: Two times a day (BID) | INTRAVENOUS | Status: DC
  Administered 2019-01-10 – 2019-01-14 (×10): 2 g via INTRAVENOUS
  Filled 2019-01-10 (×11): qty 2

## 2019-01-10 MED ORDER — VANCOMYCIN HCL 10 G IV SOLR
1750.0000 mg | Freq: Once | INTRAVENOUS | Status: AC
  Administered 2019-01-10: 1750 mg via INTRAVENOUS
  Filled 2019-01-10: qty 1750

## 2019-01-10 NOTE — Progress Notes (Signed)
PULMONARY / CRITICAL CARE MEDICINE   NAME:  Rita Santos, MRN:  [id number], DOB:  01/14/1961, LOS: 10 ADMISSION DATE:  01/07/2019, CONSULTATION DATE:  01/02/2019 REFERRING MD:  Dr. 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  Received bystander CPR for about 10 minutes and then 2 minutes by fire department before ROSC.  Received narcan in field w/o response.  Intubated in ER.    HISTORY OF PRESENT ILLNESS:  History from ER staff and patient's coworker.  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  Received bystander CPR for about 10 minutes and then 2 minutes by fire department before ROSC.  Received narcan in field w/o response.  Intubated in ER.    She lives with her elderly parents in [place].  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  No recent exposures to anyone with respiratory infection.  Patient reported that she ate something funny on 3/07 and wasn't feeling well.  Instead of going home she went to sleep at home of the family she cares for.  She apparently told them that she felt like she would wake up after going to sleep.  She went to bed around 9 pm on 3/07.  In am of 3/08 she was snoring, and wouldn't wake up.  The family called another caregiver who arrived around 1230 pm.  She immediately recognized something was wrong, had them call 911 and started CPR.  She did this for about 10 minutes and then fire department did another 2 minutes of CPR.  No shock needed per AED.  She remained hypotensive and not able to protect airway.  Intubated in ER and started on epinephrine.  Police are trying to locate her family.  SIGNIFICANT PAST MEDICAL HISTORY   Bipolar, Chronic opiate medication use, HTN, Allergies,  Insomnia  SIGNIFICANT EVENTS:  3/08 Admit  STUDIES:   CT head 3/08 >> small vessel white matter disease  01/02/2019 MRI head.  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  She cortical-based infarcts.  Findings favor drug-induced vasculitis/vasospasm.  Left middle frontal gyrus cortical signal abnormality could questionably be ischemic or possibly seizure activity. 3/12 CXR- bibasilar atelectasis  3/12 EEG> continuous slow generalized activity. Moderate diffuse encephalopathy. No seizures recorded.  3/18 CXR> improved lung volumes, no edema CULTURES:  Blood 3/08 >> Sputum 3/08 >> staph aureus>> Influenza PCR 3/08 >> Respiratory viral panel 3/08 >> Pneumococcal Ag 3/08 >> 12/2018+ MRSA screen nasopharynx ANTIBIOTICS:  Vancomycin 3/08 >>3/11 Zosyn 3/08 >> 3/11 Ancef 3/11> 3/14 Vanc 3/18> Cefepime 3/18> LINES/TUBES:  ETT 3/08 >>   CONSULTANTS:  Neurology stroke service (now signed off)  Palliative Care Medicine (consult placed 3/16)  SUBJECTIVE:  New leukocytosis and T 101 overnight Neuro status remains poor   CONSTITUTIONAL: BP (!) 160/88   Pulse 99   Temp (!) 101 F (38.3 C) (Oral)   Resp (!) 31   Ht 5\' 6"  (1.676 m)   Wt 81.6 kg   SpO2 100%   BMI 29.04 kg/m   I/O last 3 completed shifts: In: 3010 [I.V.:280; NG/GT:2730] Out: 2970 [Urine:2870; Stool:100]     Vent  Mode: PSV;CPAP FiO2 (%):  [40 %] 40 % Set Rate:  [20 bmp] 20 bmp Vt Set:  [470 mL] 470 mL PEEP:  [5 cmH20] 5 cmH20 Pressure Support:  [8 cmH20] 8 cmH20 Plateau Pressure:  [14 cmH20-18 cmH20] 18 cmH20  PHYSICAL EXAM:   General: ill appearing adult female, intubated, NAD HEENT: NCAT ETT OGT secure, Anicteric sclera  Neuro: Does not respond to voice, Withdraws to pain. Upward gaze, PERRL, cough intact  CV: RRR s1s2 no r/g/m 2+ radial pulses bilaterally PULM: CTA bilaterally no accessory muscle recruitment GI: soft, round, nd,  normoactive bowel sounds x4  Extremities: Symmetrical bulk and tone, no obvious deformity, no edema  Skin:  Clean, dry, warm, intact without rash    RESOLVED PROBLEM LIST   Shock   ASSESSMENT AND PLAN    Acute respiratory failure with hypoxia and compromised airway. -Staph Aureus Pneumonia - s/p course of ancef for staph aureus PNA Plan - continue VAP bundle -Continue SBT as tolerated -Not a candidate for extubation given poor neuro status -Palliative care meeting scheduled for this afternoon (previously, a discussion had had occurred with husband about a trach. However given poor neuro status PCCM appreciates palliative involvement for GOC) -AM CXR  Bilateral Watershed border zone infarcts  -Watershed infarct pattern on MRI  Plan - Appreciate neurology's recs regarding watershed infarct, neurology has signed off  - Minimize sedation as possible, RASS goal 0 - Continue neuro checks  - atorvastatin  -Palliative Care Medicine holding family meeting this afternoon RE GOC.  -Neuro status remains poor   Bipolar disorder Substance abuse Insomnia Plan -Minimize sedating agents as possible, PRN versed and PRN fentanyl -No home antipsychotics at this time given patient's poor neuro status (do not want to administer sedating medications)  Acute Kidney Injury -Cr .98 to 1.54 -improving, decrease to 1.25 today  Plan - Continue strict I/O - continue to monitor BUN/Cr on BMP - continue hydration   Transaminitis, improving  -Elevated LFTs from hypoxia, shock. Plan -check LFTs PRN  Constipation P BID senokot QD colace QD PRN dulcolax   Hypertension P Continue TID labetalol and PRN labetalol   Malnutrition, at risk P -Contine EN   Goals of Care: -Family meeting with palliative care scheduled for today 3/18   SIRS - new leukocytosis 18.9, new Temp 102.2, tachycardia  -Sending Bcx, UA.reflex, Tracheal aspirate -Will start empiric vanc/cef and de-escalate as cultures  return   Best Practice  / Disposition.   Pain and Sedation: PRN fentanyl, PRN versed  DVT PROPHYLAXIS: SQ hepain SUP: Protonix  NUTRITION: EN  MOBILITY: Bed rest CODE STATUS: Full FAMILY DISCUSSIONS: No family at bedside, family meeting with PCM scheduled for 3/18 afternoon DISPOSITION: ICU  LABS  Glucose Recent Labs  Lab 01/09/19 1111 01/09/19 1521 01/09/19 2033 01/09/19 2344 01/10/19 0342 01/10/19 0737  GLUCAP 157* 152* 163* 131* 128* 125*    BMET Recent Labs  Lab 01/08/19 0325 01/08/19 0412 01/09/19 0337 01/10/19 0444  NA 141 143 141 144  K 4.1 4.3 4.3 4.0  CL 109  --  107 108  CO2 26  --  24 26  BUN 32*  --  52* 52*  CREATININE 0.98  --  1.54* 1.25*  GLUCOSE 135*  --  133* 156*    Liver Enzymes Recent Labs  Lab 01/09/19 0337  AST 37  ALT 44  ALKPHOS 65  BILITOT 0.9  ALBUMIN 3.3*    Electrolytes Recent Labs  Lab 01/06/19 0420 01/07/19 0333 01/08/19 0325  01/09/19 0337 01/10/19 0444  CALCIUM 9.4 8.8* 9.0 9.1 9.4  MG 2.2 2.1 2.2  --   --   PHOS 3.1 2.9 3.6  --   --     CBC Recent Labs  Lab 01/08/19 0325 01/08/19 0412 01/09/19 0337 01/10/19 0444  WBC 14.0*  --  13.1* 18.9*  HGB 11.7* 11.6* 11.9* 12.2  HCT 36.4 34.0* 36.9 39.0  PLT 249  --  273 287    ABG Recent Labs  Lab 01/06/19 0315 01/07/19 0450 01/08/19 0412  PHART 7.462* 7.418 7.450  PCO2ART 40.7 42.8 41.2  PO2ART 123.0* 118.0* 99.0    Coag's No results for input(s): APTT, INR in the last 168 hours. Sepsis Markers No results for input(s): LATICACIDVEN, PROCALCITON, O2SATVEN in the last 168 hours. Cardiac Enzymes No results for input(s): TROPONINI, PROBNP in the last 168 hours.   Critical care Time: 40 minutes   Tessie Fass MSN, AGACNP-BC [facility] Pulmonary/Critical Care Medicine [phone] If no answer, [phone] 01/10/2019, 10:37 AM

## 2019-01-10 NOTE — Progress Notes (Signed)
Pharmacy Antibiotic Note  Rita Santos is a 58 y.o. female admitted on January 09, 2019 with respiratory failure requiring intubation.  Patient previously treated this admission for MSSA pneumonia. Due to new fevers and leukocytosis, pharmacy has been consulted for vancomycin and cefepime dosing.  Tmax of 102.3 (required cooling blanket overnight) and WBC up to 18.9 today. Renal function above baseline at 1.25 today. Collecting new cultures.  Plan: Cefepime 2 g IV q12h Vancomycin 1750 mg IV x1, then 1250 mg IV Q 24 hrs. Goal AUC 400-550. Expected AUC: 489.7 SCr used: 1.25 Monitor clinical improvement, renal function, C&S, and ability to de-escalate antibiotics  Height: 5\' 6"  (167.6 cm) Weight: 179 lb 14.3 oz (81.6 kg) IBW/kg (Calculated) : 59.3  Temp (24hrs), Avg:101.1 F (38.4 C), Min:99 F (37.2 C), Max:102.3 F (39.1 C)  Recent Labs  Lab 01/06/19 0420 01/07/19 0333 01/08/19 0325 01/09/19 0337 01/10/19 0444  WBC 10.5 11.7* 14.0* 13.1* 18.9*  CREATININE 0.88 0.88 0.98 1.54* 1.25*    Estimated Creatinine Clearance: 54.1 mL/min (A) (by C-G formula based on SCr of 1.25 mg/dL (H)).    No Known Allergies  Antimicrobials this admission: Zosyn 3/8>>3/11 Vancomycin 3/8>>3/12; 3/18 >> Cefazolin 3/11>>3/14 Cefepime 3/18 >>  Dose adjustments this admission: N/A  Microbiology results: 3/8BCx: NGF 3/8UCx: NGF  3/8Sputum: MSSA, pan sensitive  3/8 RespPCR:neg 3/8 MRSA PCR pos  Thank you for allowing pharmacy to be a part of this patient's care.  Arvilla Market, PharmD PGY1 Pharmacy Resident Phone [phone] 01/10/2019     11:19 AM

## 2019-01-10 NOTE — Consult Note (Signed)
Consultation Note Date: 01/10/2019   Patient Name: Rita Santos  DOB: 1961/10/16  MRN: [id number]  Age / Sex: 58 y.o., female  PCP: Patient, No Pcp Per Referring Physician: Rush Farmer, MD  Reason for Consultation: Establishing goals of care  HPI/Patient Profile: 58 y.o. female  with past medical history of bipolar disorder, chronic opiate use, and HTN admitted on 01/12/2019 with cardiac arrest. Unknown downtime. Received bystander CPR x 10 minutes and then 2 minutes by fire department before ROSC. She was intubated in ER. 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 UDS was positive for opiates and cocaine. Patient has remained unresponsive off of sedation. PMT consulted for [place].  Clinical Assessment and Goals of Care: I have reviewed medical records including EPIC notes, labs and imaging, received report from RN, Dr. Chase Caller, and Dr. Valeta Harms, assessed the patient and then met with patient's daughter, Providence Lanius, and husband (separated but legally married) Barbaraann Rondo,  to discuss diagnosis prognosis, [place], EOL wishes, disposition and options.  I introduced Palliative Medicine as specialized medical care for people living with serious illness. It focuses on providing relief from the symptoms and stress of a serious illness. The goal is to improve quality of life for both the patient and the family.  We discussed a brief life review of the patient. They tell me how patient has worked as a Building control surveyor. She and Barbaraann Rondo were together ~20 years, but have been separated for the past 4-5 years. They are on good terms. They describe her as vibrant, funny, "never met a stranger".    We discussed her current illness and what it means in the larger context of her on-going co-morbidities.  Natural disease trajectory and expectations at EOL were discussed. We discussed evidence of severe brain injury - discussed MRI results and clinical exam - patient not following  commands, no purposeful interactions. Discussed dependence on mechanical ventilation.  I attempted to elicit values and goals of care important to the patient.  They share the patient would never want to live in a facility and be dependent on others as she has cared for many that were dependent on her.  The difference between aggressive medical intervention and comfort care was considered in light of the patient's goals of care. We discussed continued aggressive care: trach, peg, facility placement vs comfort care: freeing her from ventilator and focusing on comfort. Family shares they are leaning towards comfort care but request time to discuss with other family members.  We discussed what serving as surrogate decision maker means: honoring what the patient would want in the situation.  Patient's husband shares that he wants her kids to make the decision since he is separated from the patient.   We discussed that palliative care will follow up tomorrow to continue to discuss situation.   Questions and concerns were addressed. The family was encouraged to call with questions or concerns.   Primary Decision Maker NEXT OF KIN: spouse Barbaraann Rondo joined by patient's children   SUMMARY OF RECOMMENDATIONS    - initial discussion with family about continued aggressive care vs. Comfort care - family indicates they do not think patient would want trach/peg; however, would like to discuss situation with other family members before making final decision - PMT to follow up tomorrow  Code Status/Advance Care Planning:  Full code   Symptom Management:   Per primary  Palliative Prophylaxis:   Aspiration, Delirium Protocol, Frequent Pain Assessment, Oral Care and Turn Reposition  Additional Recommendations (Limitations, Scope, Preferences):  Full Scope Treatment  Prognosis:   Unable to determine  Discharge Planning: To Be Determined      Primary Diagnoses: Present on Admission: . Acute  respiratory failure with hypoxia ([place])   I have reviewed the medical record, interviewed the patient and family, and examined the patient. The following aspects are pertinent.  Past Medical History:  Diagnosis Date  . Allergy   . Anxiety   . Hypertension    Social History   Socioeconomic History  . Marital status: Married    Spouse name: Not on file  . Number of children: Not on file  . Years of education: Not on file  . Highest education level: Not on file  Occupational History  . Not on file  Social Needs  . Financial resource strain: Not on file  . Food insecurity:    Worry: Not on file    Inability: Not on file  . Transportation needs:    Medical: Not on file    Non-medical: Not on file  Tobacco Use  . Smoking status: Current Every Day Smoker    Packs/day: 0.25    Years: 18.00    Pack years: 4.50    Types: Cigarettes  . Smokeless tobacco: Never Used  Substance and Sexual Activity  . Alcohol use: Yes    Alcohol/week: 0.0 standard drinks    Comment: occasionally  . Drug use: No  . Sexual activity: Not on file  Lifestyle  . Physical activity:    Days per week: Not on file    Minutes per session: Not on file  . Stress: Not on file  Relationships  . Social connections:    Talks on phone: Not on file    Gets together: Not on file    Attends religious service: Not on file    Active member of club or organization: Not on file    Attends meetings of clubs or organizations: Not on file    Relationship status: Not on file  Other Topics Concern  . Not on file  Social History Narrative  . Not on file   Family History  Problem Relation Age of Onset  . Hypertension Mother   . Hypertension Sister   . Cancer Paternal Grandfather        lung   Scheduled Meds: . aspirin  81 mg Oral Daily  . atorvastatin  10 mg Oral q1800  . chlorhexidine gluconate (MEDLINE KIT)  15 mL Mouth Rinse BID  . Chlorhexidine Gluconate Cloth  6 each Topical Q0600  . docusate  100 mg  Oral Daily  . heparin injection (subcutaneous)  5,000 Units Subcutaneous Q8H  . insulin aspart  1-3 Units Subcutaneous Q4H  . labetalol  200 mg Oral TID  . mouth rinse  15 mL Mouth Rinse 10 times per day  . mupirocin ointment  1 application Nasal BID  . pantoprazole sodium  40 mg Per Tube Daily  . sennosides  10 mL Oral BID   Continuous Infusions: . sodium chloride    . feeding supplement (VITAL AF 1.2 CAL) 1,000 mL (01/10/19 0636)   PRN Meds:.sodium chloride, acetaminophen, bisacodyl, fentaNYL (SUBLIMAZE) injection, labetalol, midazolam No Known Allergies Review of Systems  Unable to perform ROS: Intubated    Physical Exam Constitutional:      General: She is not in acute distress.    Interventions: She is intubated.  HENT:     Head: Normocephalic and atraumatic.  Cardiovascular:     Rate and Rhythm: Normal rate  and regular rhythm.  Pulmonary:     Effort: Pulmonary effort is normal. She is intubated.     Breath sounds: Normal breath sounds.  Abdominal:     Palpations: Abdomen is soft.  Musculoskeletal:     Right lower leg: No edema.     Left lower leg: No edema.  Skin:    General: Skin is warm and dry.  Neurological:     Mental Status: She is unresponsive.     Vital Signs: BP (!) 160/88   Pulse 99   Temp (!) 101 F (38.3 C) (Oral)   Resp (!) 31   Ht '5\' 6"'  (1.676 m)   Wt 81.6 kg   SpO2 100%   BMI 29.04 kg/m  Pain Scale: CPOT       SpO2: SpO2: 100 % O2 Device:SpO2: 100 % O2 Flow Rate: .   IO: Intake/output summary:   Intake/Output Summary (Last 24 hours) at 01/10/2019 1044 Last data filed at 01/10/2019 0900 Gross per 24 hour  Intake 1780 ml  Output 1515 ml  Net 265 ml    LBM: Last BM Date: 01/07/19 Baseline Weight: Weight: 86.2 kg Most recent weight: Weight: 81.6 kg     Palliative Assessment/Data: PPS 30%   Flowsheet Rows     Most Recent Value  Intake Tab  Referral Department  Critical care  Unit at Time of Referral  ICU  Palliative Care  Primary Diagnosis  Cardiac  Date Notified  01/08/19  Palliative Care Type  New Palliative care  Reason for referral  Clarify Goals of Care  Date of Admission  01/04/2019  # of days IP prior to Palliative referral  8  Clinical Assessment  Psychosocial & Spiritual Assessment  Palliative Care Outcomes     Time Total: 80 minutes Greater than 50%  of this time was spent counseling and coordinating care related to the above assessment and plan.  Juel Burrow, DNP, AGNP-C Palliative Medicine Team [phone] Pager: [phone]

## 2019-01-11 ENCOUNTER — Inpatient Hospital Stay (HOSPITAL_COMMUNITY): Payer: BLUE CROSS/BLUE SHIELD

## 2019-01-11 LAB — GLUCOSE, CAPILLARY
GLUCOSE-CAPILLARY: 125 mg/dL — AB (ref 70–99)
Glucose-Capillary: 116 mg/dL — ABNORMAL HIGH (ref 70–99)
Glucose-Capillary: 118 mg/dL — ABNORMAL HIGH (ref 70–99)
Glucose-Capillary: 122 mg/dL — ABNORMAL HIGH (ref 70–99)
Glucose-Capillary: 130 mg/dL — ABNORMAL HIGH (ref 70–99)
Glucose-Capillary: 139 mg/dL — ABNORMAL HIGH (ref 70–99)

## 2019-01-11 LAB — CBC
HCT: 33.4 % — ABNORMAL LOW (ref 36.0–46.0)
Hemoglobin: 10.3 g/dL — ABNORMAL LOW (ref 12.0–15.0)
MCH: 28.2 pg (ref 26.0–34.0)
MCHC: 30.8 g/dL (ref 30.0–36.0)
MCV: 91.5 fL (ref 80.0–100.0)
Platelets: 243 10*3/uL (ref 150–400)
RBC: 3.65 MIL/uL — AB (ref 3.87–5.11)
RDW: 14.4 % (ref 11.5–15.5)
WBC: 13.9 10*3/uL — ABNORMAL HIGH (ref 4.0–10.5)
nRBC: 0 % (ref 0.0–0.2)

## 2019-01-11 LAB — BASIC METABOLIC PANEL
ANION GAP: 10 (ref 5–15)
BUN: 45 mg/dL — ABNORMAL HIGH (ref 6–20)
CO2: 22 mmol/L (ref 22–32)
Calcium: 9.4 mg/dL (ref 8.9–10.3)
Chloride: 111 mmol/L (ref 98–111)
Creatinine, Ser: 0.9 mg/dL (ref 0.44–1.00)
GFR calc Af Amer: >60 mL/min (ref >60)
GFR calc non Af Amer: >60 mL/min (ref >60)
GLUCOSE: 148 mg/dL — AB (ref 70–99)
Potassium: 3.9 mmol/L (ref 3.5–5.1)
Sodium: 143 mmol/L (ref 135–145)

## 2019-01-11 MED ORDER — VANCOMYCIN HCL 10 G IV SOLR
1750.0000 mg | INTRAVENOUS | Status: DC
  Administered 2019-01-11 – 2019-01-12 (×2): 1750 mg via INTRAVENOUS
  Filled 2019-01-11 (×2): qty 1750

## 2019-01-11 NOTE — Progress Notes (Signed)
Spoke w/ pts dtr Cori who called for updates on pt.  Per pts dtr,waiting on a family member stuck in [place] to get back to [facility] before making decision on plan of care for pt. Pts dtr states aware of approaching two week deadline for trach decision.

## 2019-01-11 NOTE — Progress Notes (Signed)
Spoke with ICU RN and examined patient at bedside.   Gave RN my pager number to reach out when family is at bedside.  Day 11 intubation.  Patient's eye lids flutter with a speak her name loudly.  Other she does not respond to me.  She is hot to the touch.  CV rrr, resp intubated with no distress.  Upward babinski bilaterally.  I called Cori (dtr) but my call went unanswered.     PMT on stand by to continue supportive conversations with family when they arrive.  Norvel Richards, PA-C Palliative Medicine Pager: [phone]   15 min.

## 2019-01-11 NOTE — Progress Notes (Signed)
PULMONARY / CRITICAL CARE MEDICINE   NAME:  Rita Santos, MRN:  [id number], DOB:  [birth year], LOS: 11 ADMISSION DATE:  01/09/2019, CONSULTATION DATE:  01/23/2019 REFERRING MD:  Dr. Jeraldine Loots, ER, CHIEF COMPLAINT:  Respiratory failure  BRIEF HISTORY:     History from ER staff and patient's coworker.  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  Received bystander CPR for about 10 minutes and then 2 minutes by fire department before ROSC.  Received narcan in field w/o response.  Intubated in ER.    She lives with her elderly parents in [place].  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  No recent exposures to anyone with respiratory infection.  Patient reported that she ate something funny on 3/07 and wasn't feeling well.  Instead of going home she went to sleep at home of the family she cares for.  She apparently told them that she felt like she would wake up after going to sleep.  She went to bed around 9 pm on 3/07.  In am of 3/08 she was snoring, and wouldn't wake up.  The family called another caregiver who arrived around 1230 pm.  She immediately recognized something was wrong, had them call 911 and started CPR.  She did this for about 10 minutes and then fire department did another 2 minutes of CPR.  No shock needed per AED.  She remained hypotensive and not able to protect airway.  Intubated in ER and started on epinephrine.  Police are trying to locate her family.  SIGNIFICANT PAST MEDICAL HISTORY   Bipolar, Chronic opiate medication use, HTN, Allergies, Insomnia  SIGNIFICANT EVENTS:  3/08 Admit  3/18 - New leukocytosis and T 101 overnight Neuro status remains poor   STUDIES:   CT head 3/08 >> small vessel white matter disease  01/02/2019 MRI head.  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   She cortical-based infarcts.  Findings favor drug-induced vasculitis/vasospasm.  Left middle frontal gyrus cortical signal abnormality could questionably be ischemic or possibly seizure activity. 3/12 CXR- bibasilar atelectasis  3/12 EEG> continuous slow generalized activity. Moderate diffuse encephalopathy. No seizures recorded.  3/18 CXR> improved lung volumes, no edema CULTURES:  Blood 3/08 >> MRSA PCR 3/8 - POSITIVe Sputum 3/08 >> staph aureus>> MSSA Influenza PCR 3/08 >> neg Respiratory viral panel 3/08 >> neg Pneumococcal Ag 3/08 >> 12/2018+ MRSA screen nasopharynx ANTIBIOTICS:  Vancomycin 3/08 >>3/11 Zosyn 3/08 >> 3/11 Ancef 3/11> 3/14 Vanc 3/18> Cefepime 3/18> LINES/TUBES:  ETT 3/08 >>   CONSULTANTS:  Neurology stroke service (now signed off)  Palliative Care Medicine (consult placed 3/16)  SUBJECTIVE:    - 01/11/2019  - per pmt - family leaning towards comfort but not formalized yet.Remains unresponsive on vent,. STill full code though and family not around today . Palliative rounding  CONSTITUTIONAL: BP (!) 166/93   Pulse 94   Temp 100 F (37.8 C) (Oral)   Resp (!) 36   Ht 5\' 6"  (1.676 m)   Wt 81.5 kg   SpO2 100%   BMI 29.00 kg/m   I/O last 3 completed shifts: In: 3856.6 [I.V.:541.6; NG/GT:2570; IV Piggyback:745] Out: 2510 [Urine:2260; Stool:250]     Vent Mode: PSV;CPAP FiO2 (%):  [40 %] 40 % Set Rate:  [20 bmp] 20 bmp  Vt Set:  [470 mL] 470 mL PEEP:  [5 cmH20] 5 cmH20 Pressure Support:  [8 cmH20] 8 cmH20 Plateau Pressure:  [13 cmH20-18 cmH20] 14 cmH20  PHYSICAL EXAM:  General Appearance:  Looks criticall il Head:  Normocephalic, without obvious abnormality, atraumatic Eyes:  PERRL - yes, conjunctiva/corneas - muddy     Ears:  Normal external ear canals, both ears Nose:  G tube - no Throat:  ETT TUBE - yes , OG tube - yes Neck:  Supple,  No enlargement/tenderness/nodules Lungs: Clear to auscultation bilaterally, Ventilator   Synchrony -  yes Heart:  S1 and S2 normal, no murmur, CVP - no.  Pressors - no Abdomen:  Soft, no masses, no organomegaly Genitalia / Rectal:  Not done Extremities:  Extremities- intacg Skin:  ntact in exposed areas . Sacral area - not examined Neurologic:  Sedation - none -> RASS - -4     LABS    PULMONARY Recent Labs  Lab 01/06/19 0315 01/07/19 0450 01/08/19 0412  PHART 7.462* 7.418 7.450  PCO2ART 40.7 42.8 41.2  PO2ART 123.0* 118.0* 99.0  HCO3 29.1* 27.4 28.4*  TCO2 O2SAT 99.0 99.0 98.0    CBC Recent Labs  Lab 01/09/19 0337 01/10/19 0444 01/10/19 1134  HGB 11.9* 12.2 11.8*  HCT 36.9 39.0 37.8  WBC 13.1* 18.9* 16.4*  PLT 273 287 264    COAGULATION No results for input(s): INR in the last 168 hours.  CARDIAC  No results for input(s): TROPONINI in the last 168 hours. No results for input(s): PROBNP in the last 168 hours.   CHEMISTRY Recent Labs  Lab 01/06/19 0420 01/07/19 0333  01/08/19 0325 01/08/19 0412 01/09/19 0337 01/10/19 0444 01/11/19 0243  NA 143 142   < > 141 143 141 144 143  K 3.3* 3.7   < > 4.1 4.3 4.3 4.0 3.9  CL 107 108  --  109  --  107 108 111  CO2 25 23  --  26  --  GLUCOSE 139* 127*  --  135*  --  133* 156* 148*  BUN 23* 25*  --  32*  --  52* 52* 45*  CREATININE 0.88 0.88  --  0.98  --  1.54* 1.25* 0.90  CALCIUM 9.4 8.8*  --  9.0  --  9.1 9.4 9.4  MG 2.2 2.1  --  2.2  --   --   --   --   PHOS 3.1 2.9  --  3.6  --   --   --   --    < > = values in this interval not displayed.   Estimated Creatinine Clearance: 75.1 mL/min (by C-G formula based on SCr of 0.9 mg/dL).   LIVER Recent Labs  Lab 01/09/19 0337  AST 37  ALT 44  ALKPHOS 65  BILITOT 0.9  PROT 6.7  ALBUMIN 3.3*     INFECTIOUS No results for input(s): LATICACIDVEN, PROCALCITON in the last 168 hours.   ENDOCRINE CBG (last 3)  Recent Labs    01/11/19 0423 01/11/19 0736 01/11/19 1208  GLUCAP 118* 122* 139*         IMAGING x48h  - image(s)  personally visualized  -   highlighted in bold Dg Chest Port 1 View  Result Date: 01/11/2019 CLINICAL DATA:  Respiratory failure. EXAM: PORTABLE CHEST 1 VIEW COMPARISON:  01/10/2019. FINDINGS: Endotracheal tube and NG tube in stable position. Tiny tubular density noted over the right apex,  this may be outside the patient. Tiny catheter fragment can not be excluded. Attention on subsequent exams suggested. Cardiomegaly. No pulmonary venous congestion. Low lung volumes with mild bibasilar atelectasis. No pleural effusion or pneumothorax. IMPRESSION: 1. Endotracheal tube and NG tube in stable position. Tiny tubular density noted over the right apex, this may be outside the patient. Tiny catheter fragment can not be excluded. Attention on subsequent exams suggested. 2.  Low lung volumes with mild bibasilar atelectasis. Electronically Signed   By: Maisie Fus  Register   On: 01/11/2019 06:26   Dg Chest Port 1 View  Result Date: 01/10/2019 CLINICAL DATA:  Respiratory failure EXAM: PORTABLE CHEST 1 VIEW COMPARISON:  Yesterday FINDINGS: Endotracheal tube tip is between the clavicular heads and carina. The orogastric tube reaches the stomach. Improved inflation. There is no edema, consolidation, effusion, or pneumothorax. Normal heart size IMPRESSION: 1. Improved inflation. 2. No evidence of active cardiopulmonary disease. Electronically Signed   By: Marnee Spring M.D.   On: 01/10/2019 08:52     RESOLVED PROBLEM LIST   Shock   ASSESSMENT AND PLAN    Acute respiratory failure with hypoxia and compromised airway. -Staph Aureus Pneumonia - s/p course of ancef for staph aureus PNA   01/11/2019 - > does not meet criteria for SBT/Extubation in setting of Acute Respiratory Failure due to coma   Plan - PRVC - continue VAP bundle -Continue SBT as tolerated -Not a candidate for extubation given poor neuro status    Bilateral Watershed border zone infarcts  -Watershed infarct pattern on MRI   01/11/2019 -  remains comatose  Plan - Appreciate neurology's recs regarding watershed infarct, neurology has signed off  - Minimize sedation as possible, RASS goal 0 - Continue neuro checks  - atorvastatin  -Palliative Care Medicine holding family meeting this afternoon RE GOC.  -Neuro status remains poor   Bipolar disorder Substance abuse Insomnia Plan -Minimize sedating agents as possible, PRN versed and PRN fentanyl -No home antipsychotics at this time given patient's poor neuro status (do not want to administer sedating medications)  Acute Kidney Injury  - 3/19 - resolved on this day  Plan - Continue strict I/O - continue to monitor BUN/Cr on BMP - continue hydration   Transaminitis, improving = -Elevated LFTs from hypoxia, shock.  3/19 - resolvee  Plan -check LFTs PRN  Constipation P BID senokot QD colace QD PRN dulcolax   Hypertension P Continue TID labetalol and PRN labetalol   Malnutrition, at risk P -Contine EN   Goals of Care: -Family meeting with palliative care scheduled  3/18 and ongiing     Best Practice  / Disposition.   Pain and Sedation: PRN fentanyl, PRN versed  DVT PROPHYLAXIS: SQ hepain SUP: Protonix  NUTRITION: EN  MOBILITY: Bed rest CODE STATUS: Full FAMILY DISCUSSIONS: No family at bedside, 01/11/2019  DISPOSITION: ICU  NO LABS FOR 01/12/19    ATTESTATION & SIGNATURE   The patient Rita Santos is critically ill with multiple organ systems failure and requires high complexity decision making for assessment and support, frequent evaluation and titration of therapies, application of advanced monitoring technologies and extensive interpretation of multiple databases.   Critical Care Time devoted to patient care services described in this note is  30  Minutes. This time reflects time of care of this signee Dr Kalman Shan. This critical care time does not reflect procedure time, or teaching time or supervisory time of PA/NP/Med  student/Med Resident etc but could involve care discussion time  Dr. Kalman Shan, M.D., [facility].C.P Pulmonary and Critical Care Medicine Staff Physician [facility] Pager: [phone], If no answer or between  15:00h - 7:00h: call [phone]  01/11/2019 3:04 PM

## 2019-01-11 NOTE — Progress Notes (Signed)
Pharmacy Antibiotic Note  Rita Santos is a 58 y.o. female admitted on 01-29-19 with respiratory failure requiring intubation. Patient previously treated this admission for MSSA pneumonia. Due to new fevers and leukocytosis, pharmacy has been consulted for vancomycin and cefepime dosing.  Tmax down to 100.9 and WBC down to 16.4 today. Renal function improved to around baseline at 0.9 today. Preliminary respiratory culture shows GPCs in pairs.  Plan: Continue Cefepime 2 g IV q12h Change Vancomycin to 1750 mg IV Q 24 hrs. Goal AUC 400-550. Expected AUC: 508.6 SCr used: 0.9 Monitor clinical improvement, renal function, C&S, and ability to de-escalate antibiotics  Height: 5\' 6"  (167.6 cm) Weight: 179 lb 10.8 oz (81.5 kg) IBW/kg (Calculated) : 59.3  Temp (24hrs), Avg:100.2 F (37.9 C), Min:99.2 F (37.3 C), Max:100.9 F (38.3 C)  Recent Labs  Lab 01/07/19 0333 01/08/19 0325 01/09/19 0337 01/10/19 0444 01/10/19 1134 01/11/19 0243  WBC 11.7* 14.0* 13.1* 18.9* 16.4*  --   CREATININE 0.88 0.98 1.54* 1.25*  --  0.90    Estimated Creatinine Clearance: 75.1 mL/min (by C-G formula based on SCr of 0.9 mg/dL).    No Known Allergies  Antimicrobials this admission: Zosyn 3/8>>3/11 Vancomycin 3/8>>3/12; 3/18 >> Cefazolin 3/11>>3/14 Cefepime 3/18 >>  Dose adjustments this admission: N/A  Microbiology results: 3/18 Bcx: NGTD 3/18 TA: GPCs in pairs 3/8BCx: NGF 3/8UCx: NGF  3/8Sputum: MSSA, pan sensitive  3/8 RespPCR:neg 3/8 MRSA PCR pos  Thank you for allowing pharmacy to be a part of this patient's care.  Arvilla Market, PharmD PGY1 Pharmacy Resident Phone [phone] 01/11/2019     8:14 AM

## 2019-01-12 ENCOUNTER — Inpatient Hospital Stay (HOSPITAL_COMMUNITY): Payer: BLUE CROSS/BLUE SHIELD

## 2019-01-12 LAB — CBC
HCT: 36.1 % (ref 36.0–46.0)
Hemoglobin: 10.9 g/dL — ABNORMAL LOW (ref 12.0–15.0)
MCH: 28 pg (ref 26.0–34.0)
MCHC: 30.2 g/dL (ref 30.0–36.0)
MCV: 92.8 fL (ref 80.0–100.0)
NRBC: 0 % (ref 0.0–0.2)
Platelets: 266 10*3/uL (ref 150–400)
RBC: 3.89 MIL/uL (ref 3.87–5.11)
RDW: 14.2 % (ref 11.5–15.5)
WBC: 15.2 10*3/uL — ABNORMAL HIGH (ref 4.0–10.5)

## 2019-01-12 LAB — MAGNESIUM: Magnesium: 2.1 mg/dL (ref 1.7–2.4)

## 2019-01-12 LAB — BASIC METABOLIC PANEL
ANION GAP: 6 (ref 5–15)
BUN: 33 mg/dL — ABNORMAL HIGH (ref 6–20)
CO2: 21 mmol/L — ABNORMAL LOW (ref 22–32)
Calcium: 9.2 mg/dL (ref 8.9–10.3)
Chloride: 113 mmol/L — ABNORMAL HIGH (ref 98–111)
Creatinine, Ser: 0.78 mg/dL (ref 0.44–1.00)
GFR calc Af Amer: >60 mL/min (ref >60)
GFR calc non Af Amer: >60 mL/min (ref >60)
Glucose, Bld: 132 mg/dL — ABNORMAL HIGH (ref 70–99)
Potassium: 4 mmol/L (ref 3.5–5.1)
Sodium: 140 mmol/L (ref 135–145)

## 2019-01-12 LAB — CULTURE, RESPIRATORY W GRAM STAIN: Gram Stain: NONE SEEN

## 2019-01-12 LAB — PHOSPHORUS: Phosphorus: 3.6 mg/dL (ref 2.5–4.6)

## 2019-01-12 LAB — GLUCOSE, CAPILLARY
GLUCOSE-CAPILLARY: 115 mg/dL — AB (ref 70–99)
Glucose-Capillary: 132 mg/dL — ABNORMAL HIGH (ref 70–99)
Glucose-Capillary: 134 mg/dL — ABNORMAL HIGH (ref 70–99)
Glucose-Capillary: 124 mg/dL — ABNORMAL HIGH (ref 70–99)
Glucose-Capillary: 127 mg/dL — ABNORMAL HIGH (ref 70–99)
Glucose-Capillary: 108 mg/dL — ABNORMAL HIGH (ref 70–99)

## 2019-01-12 LAB — CULTURE, RESPIRATORY

## 2019-01-12 NOTE — Progress Notes (Signed)
I received a call from the patient's husband stating he had discussed Jeniya's situation with all of her children and they were in agreement with comfort care.  He still needed to discussed things with her parents but he felt that if they were in agreement the family would want extubation on Saturday or Sunday.  He wanted to ensure that her parents would be able to visit to say good bye to her.  I assure him that they would be allowed to visit to say good bye to Arabi.   We discussed code status, and he agreed with DNR at this point.   Norvel Richards, PA-C Palliative Medicine Pager: [phone]  Time 15 min.

## 2019-01-12 NOTE — Progress Notes (Signed)
PULMONARY / CRITICAL CARE MEDICINE   NAME:  Rita Santos, MRN:  [id number], DOB:  [birth year], LOS: 12 ADMISSION DATE:  01-02-2019, CONSULTATION DATE:  01-02-19 REFERRING MD:  Dr. Jeraldine Loots, ER, CHIEF COMPLAINT:  Respiratory failure  BRIEF HISTORY:     History from ER staff and patient's coworker.  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  Received bystander CPR for about 10 minutes and then 2 minutes by fire department before ROSC.  Received narcan in field w/o response.  Intubated in ER.    She lives with her elderly parents in [place].  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  No recent exposures to anyone with respiratory infection.  Patient reported that she ate something funny on 3/07 and wasn't feeling well.  Instead of going home she went to sleep at home of the family she cares for.  She apparently told them that she felt like she would wake up after going to sleep.  She went to bed around 9 pm on 3/07.  In am of 3/08 she was snoring, and wouldn't wake up.  The family called another caregiver who arrived around 1230 pm.  She immediately recognized something was wrong, had them call 911 and started CPR.  She did this for about 10 minutes and then fire department did another 2 minutes of CPR.  No shock needed per AED.  She remained hypotensive and not able to protect airway.  Intubated in ER and started on epinephrine.  Police are trying to locate her family.  SIGNIFICANT PAST MEDICAL HISTORY   Bipolar, Chronic opiate medication use, HTN, Allergies, Insomnia  SIGNIFICANT EVENTS:  3/08 Admit  3/18 - New leukocytosis and T 101 overnight Neuro status remains poor   3/19 - er pmt - family leaning towards comfort but not formalized yet.Remains unresponsive on vent,. STill full code though and family not around today . Palliative rounding  STUDIES:   CT head  3/08 >> small vessel white matter disease  01/02/2019 MRI head.  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  She cortical-based infarcts.  Findings favor drug-induced vasculitis/vasospasm.  Left middle frontal gyrus cortical signal abnormality could questionably be ischemic or possibly seizure activity. 3/12 CXR- bibasilar atelectasis  3/12 EEG> continuous slow generalized activity. Moderate diffuse encephalopathy. No seizures recorded.  3/18 CXR> improved lung volumes, no edema CULTURES:  Blood 3/08 >> MRSA PCR 3/8 - POSITIVe Sputum 3/08 >> staph aureus>> MSSA Influenza PCR 3/08 >> neg Respiratory viral panel 3/08 >> neg Pneumococcal Ag 3/08 >> 12/2018+ MRSA screen nasopharynx ............. 3./18 - acenitobacter ANTIBIOTICS:  Vancomycin 3/08 >>3/11 Zosyn 3/08 >> 3/11 Ancef 3/11> 3/14 Vanc 3/18> 3/20 Cefepime 3/18 (acenitobacer)> LINES/TUBES:  ETT 3/08 >>   CONSULTANTS:  Neurology stroke service (now signed off)  Palliative Care Medicine (consult placed 3/16)  SUBJECTIVE:    - 01/12/2019  - now dnr. Pallaitive says family coming together on comfor care goal. Acenitobacter on sputujm  CONSTITUTIONAL: BP (!) 168/86 (BP Location: Left Arm)   Pulse 81   Temp 99.6 F (37.6 C) (Oral)   Resp (!) 28   Ht  (1.676 m)   Wt 81.5 kg   SpO2 100%   BMI 29.00 kg/m   I/O last 3 completed shifts: In: 3975.3 [I.V.:770.6; NG/GT:2405;  IV Piggyback:799.7] Out: 2811 [Urine:2561; Stool:250]     Vent Mode: PSV;CPAP FiO2 (%):  [40 %] 40 % Set Rate:  [20 bmp] 20 bmp Vt Set:  [470 mL] 470 mL PEEP:  [5 cmH20] 5 cmH20 Pressure Support:  [8 cmH20] 8 cmH20 Plateau Pressure:  [13 cmH20-28 cmH20] 13 cmH20  PHYSICAL EXAM:  - no change since yesterday. Exam is same General Appearance:  Looks criticall il Head:  Normocephalic, without obvious abnormality, atraumatic Eyes:  PERRL - yes, conjunctiva/corneas - muddy      Ears:  Normal external ear canals, both ears Nose:  G tube - no Throat:  ETT TUBE - yes , OG tube - yes Neck:  Supple,  No enlargement/tenderness/nodules Lungs: Clear to auscultation bilaterally, Ventilator   Synchrony - yes Heart:  S1 and S2 normal, no murmur, CVP - no.  Pressors - no Abdomen:  Soft, no masses, no organomegaly Genitalia / Rectal:  Not done Extremities:  Extremities- intacg Skin:  ntact in exposed areas . Sacral area - not examined Neurologic:  Sedation - none -> RASS - -4     LABS    PULMONARY Recent Labs  Lab 01/06/19 0315 01/07/19 0450 01/08/19 0412  PHART 7.462* 7.418 7.450  PCO2ART 40.7 42.8 41.2  PO2ART 123.0* 118.0* 99.0  HCO3 29.1* 27.4 28.4*  TCO2 30 29 30   O2SAT 99.0 99.0 98.0    CBC Recent Labs  Lab 01/10/19 1134 01/11/19 1427 01/12/19 0944  HGB 11.8* 10.3* 10.9*  HCT 37.8 33.4* 36.1  WBC 16.4* 13.9* 15.2*  PLT 264 243 266    COAGULATION No results for input(s): INR in the last 168 hours.  CARDIAC  No results for input(s): TROPONINI in the last 168 hours. No results for input(s): PROBNP in the last 168 hours.   CHEMISTRY Recent Labs  Lab 01/06/19 0420 01/07/19 0333  01/08/19 0325 01/08/19 0412 01/09/19 0337 01/10/19 0444 01/11/19 0243 01/12/19 0944  NA 143 142   < > 141 143 141 144 143 140  K 3.3* 3.7   < > 4.1 4.3 4.3 4.0 3.9 4.0  CL 107 108  --  109  --  107 108 111 113*  CO2 25 23  --  26  --  24 26 22  21*  GLUCOSE 139* 127*  --  135*  --  133* 156* 148* 132*  BUN 23* 25*  --  32*  --  52* 52* 45* 33*  CREATININE 0.88 0.88  --  0.98  --  1.54* 1.25* 0.90 0.78  CALCIUM 9.4 8.8*  --  9.0  --  9.1 9.4 9.4 9.2  MG 2.2 2.1  --  2.2  --   --   --   --  2.1  PHOS 3.1 2.9  --  3.6  --   --   --   --  3.6   < > = values in this interval not displayed.   Estimated Creatinine Clearance: 84.5 mL/min (by C-G formula based on SCr of 0.78 mg/dL).   LIVER Recent Labs  Lab 01/09/19 0337  AST 37  ALT 44  ALKPHOS 65   BILITOT 0.9  PROT 6.7  ALBUMIN 3.3*     INFECTIOUS No results for input(s): LATICACIDVEN, PROCALCITON in the last 168 hours.   ENDOCRINE CBG (last 3)  Recent Labs    01/12/19 0725 01/12/19 1158 01/12/19 1548  GLUCAP 134* 124* 115*         IMAGING x48h  - image(s) personally  visualized  -   highlighted in bold Dg Chest Port 1 View  Result Date: 01/12/2019 CLINICAL DATA:  58 year old female with respiratory failure. Coughing over the vent. EXAM: PORTABLE CHEST 1 VIEW COMPARISON:  01/11/2019 and earlier. FINDINGS: Portable AP semi upright view at 1051 hours. Endotracheal tube tip in good position. Enteric tube courses to the abdomen, tip not included. The small catheter projecting over the right lung apex previously does not persist. Allowing for portable technique the lungs are clear. Normal cardiac size and mediastinal contours. Paucity bowel gas in the upper abdomen. No acute osseous abnormality identified. IMPRESSION: 1. Stable ET tube and enteric tube. 2. No acute cardiopulmonary abnormality. Electronically Signed   By: Odessa Fleming M.D.   On: 01/12/2019 11:25   Dg Chest Port 1 View  Result Date: 01/11/2019 CLINICAL DATA:  Respiratory failure. EXAM: PORTABLE CHEST 1 VIEW COMPARISON:  01/10/2019. FINDINGS: Endotracheal tube and NG tube in stable position. Tiny tubular density noted over the right apex, this may be outside the patient. Tiny catheter fragment can not be excluded. Attention on subsequent exams suggested. Cardiomegaly. No pulmonary venous congestion. Low lung volumes with mild bibasilar atelectasis. No pleural effusion or pneumothorax. IMPRESSION: 1. Endotracheal tube and NG tube in stable position. Tiny tubular density noted over the right apex, this may be outside the patient. Tiny catheter fragment can not be excluded. Attention on subsequent exams suggested. 2.  Low lung volumes with mild bibasilar atelectasis. Electronically Signed   By: Maisie Fus  Register   On:  01/11/2019 06:26     RESOLVED PROBLEM LIST   Shock   ASSESSMENT AND PLAN    Acute respiratory failure with hypoxia and compromised airway. -Staph Aureus Pneumonia - s/p course of ancef for staph aureus PNA   01/12/2019 - > does not meet criteria for SBT/Extubation in setting of Acute Respiratory Failure due to coma   Plan - PRVC - continue VAP bundle -Continue SBT as tolerated -Not a candidate for extubation given poor neuro status    Bilateral Watershed border zone infarcts  -Watershed infarct pattern on MRI   01/12/2019 - remains comatose  Plan - Appreciate neurology's recs regarding watershed infarct, neurology has signed off  - Minimize sedation as possible, RASS goal 0 - Continue neuro checks  - atorvastatin  -Palliative Care Medicine holding family meeting this afternoon RE GOC.  -Neuro status remains poor   Bipolar disorder Substance abuse Insomnia Plan -Minimize sedating agents as possible, PRN versed and PRN fentanyl -No home antipsychotics at this time given patient's poor neuro status (do not want to administer sedating medications)  Acute Kidney Injury  - 3/19 - resolved on this day  Plan - Continue strict I/O - continue to monitor BUN/Cr on BMP - continue hydration   Transaminitis, improving = -Elevated LFTs from hypoxia, shock.  3/19 - resolvee  Plan -check LFTs PRN  Constipation P BID senokot QD colace QD PRN dulcolax   Hypertension P Continue TID labetalol and PRN labetalol   Malnutrition, at risk P -Contine EN   Goals of Care: -Family meeting with palliative care scheduled  3/18 and ongiing. Now DNR. Comfort wean next day or 2. No labs     Best Practice  / Disposition.   Pain and Sedation: PRN fentanyl, PRN versed  DVT PROPHYLAXIS: SQ hepain SUP: Protonix  NUTRITION: EN  MOBILITY: Bed rest CODE STATUS: Full FAMILY DISCUSSIONS: No family at bedside, 01/12/2019  DISPOSITION: ICU  NO LABS FOR  01/13/19    ATTESTATION &  SIGNATURE     Dr. Kalman Shan, M.D., [facility].C.P Pulmonary and Critical Care Medicine Staff Physician [facility] Pager: [phone], If no answer or between  15:00h - 7:00h: call [phone]  01/12/2019 4:18 PM

## 2019-01-12 NOTE — Plan of Care (Signed)
  Problem: Coping: Goal: Will verbalize positive feelings about self Outcome: Not Progressing   Problem: Health Behavior/Discharge Planning: Goal: Ability to manage health-related needs will improve Outcome: Not Progressing   Problem: Self-Care: Goal: Ability to participate in self-care as condition permits will improve Outcome: Not Progressing Goal: Verbalization of feelings and concerns over difficulty with self-care will improve Outcome: Not Progressing Goal: Ability to communicate needs accurately will improve Outcome: Not Progressing   Problem: Nutrition: Goal: Risk of aspiration will decrease Outcome: Not Progressing

## 2019-01-13 LAB — GLUCOSE, CAPILLARY
Glucose-Capillary: 130 mg/dL — ABNORMAL HIGH (ref 70–99)
Glucose-Capillary: 118 mg/dL — ABNORMAL HIGH (ref 70–99)
Glucose-Capillary: 138 mg/dL — ABNORMAL HIGH (ref 70–99)
Glucose-Capillary: 126 mg/dL — ABNORMAL HIGH (ref 70–99)
Glucose-Capillary: 116 mg/dL — ABNORMAL HIGH (ref 70–99)
Glucose-Capillary: 122 mg/dL — ABNORMAL HIGH (ref 70–99)

## 2019-01-13 NOTE — Progress Notes (Signed)
  Palliative medicine progress note  Patient now on day 13 of intubation.  Minimally responsive to touch.  She remains on ventilator; staph aureus pneumonia as well as watershed infarcts.  Patient is a DNR  Spoke to patient's husband, Thereasa Distance, to offer support.  Thereasa Distance informs me that they are moving towards comfort care once they gather family which he anticipates being no later than Monday.   Plan  Continue continue current plan of care  Anticipate one-way extubation, liberation of equipment by Sunday or Monday  Palliative medicine to stay involved and offer support to patient and family through this difficult time  Prognosis likely hours to days  after one-way extubation.  Anticipate hospital death, but if she stabilizes could meet residential criteria.  Thank you, Eduard Roux, NP Total time: 15 minutes Greater than 50% of time was spent in counseling and coordination of care

## 2019-01-13 NOTE — Progress Notes (Signed)
PULMONARY / CRITICAL CARE MEDICINE   NAME:  Rita Santos, MRN:  [id number], DOB:  10-Feb-1961, LOS: 13 ADMISSION DATE:  01-08-2019, CONSULTATION DATE:  01-08-2019 REFERRING MD:  Dr. Jeraldine Loots, ER, CHIEF COMPLAINT:  Respiratory failure  BRIEF HISTORY:     History from ER staff and patient's coworker.  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  Received bystander CPR for about 10 minutes and then 2 minutes by fire department before ROSC.  Received narcan in field w/o response.  Intubated in ER.   Head CT and exam consistent with anoxic injury  SIGNIFICANT PAST MEDICAL HISTORY   Bipolar, Chronic opiate medication use, HTN, Allergies, Insomnia  SIGNIFICANT EVENTS:  3/08 Admit  3/18 - New leukocytosis and T 101 overnight Neuro status remains poor   3/19 - er pmt - family leaning towards comfort but not formalized yet.Remains unresponsive on vent,. STill full code though and family not around today . Palliative rounding  STUDIES:   CT head 3/08 >> small vessel white matter disease  01/02/2019 MRI head.  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  She cortical-based infarcts.  Findings favor drug-induced vasculitis/vasospasm.  Left middle frontal gyrus cortical signal abnormality could questionably be ischemic or possibly seizure activity. 3/12 EEG> continuous slow generalized activity. Moderate diffuse encephalopathy. No seizures recorded.   CULTURES:  Blood 3/08 >> MRSA PCR 3/8 - POSITIVe Sputum 3/08 >> staph aureus>> MSSA Influenza PCR 3/08 >> neg Respiratory viral panel 3/08 >> neg Pneumococcal Ag 3/08 >> 12/2018+ MRSA screen nasopharynx ............. 3./18 - acenitobacter ANTIBIOTICS:  Vancomycin 3/08 >>3/11 Zosyn 3/08 >> 3/11 Ancef 3/11> 3/14 Vanc 3/18> 3/20 Cefepime 3/18 (acenitobacer)> LINES/TUBES:  ETT 3/08 >>   CONSULTANTS:   Neurology stroke service (now signed off)  Palliative Care Medicine (consult placed 3/16)  SUBJECTIVE:    -Low-grade fever. Remains unresponsive, orally intubated  CONSTITUTIONAL: BP (!) 147/80   Pulse 75   Temp 98.8 F (37.1 C) (Oral)   Resp (!) 26   Ht 5\' 6"  (1.676 m)   Wt 81.5 kg   SpO2 100%   BMI 29.00 kg/m   I/O last 3 completed shifts: In: 4165.9 [I.V.:775.8; NG/GT:2590; IV Piggyback:800.1] Out: 2719 [Urine:2569; Stool:150]     Vent Mode: PSV;CPAP FiO2 (%):  [40 %-50 %] 40 % Set Rate:  [20 bmp] 20 bmp Vt Set:  [470 mL] 470 mL PEEP:  [5 cmH20] 5 cmH20 Pressure Support:  [5 cmH20-8 cmH20] 5 cmH20 Plateau Pressure:  [13 cmH20-15 cmH20] 15 cmH20  PHYSICAL EXAM:  -   Acutely ill, middle-aged woman, no distress No pallor, icterus, no JVD Minimal secretions Has oral bite-block and ET tube Eyes flicker when her name is called but does not really track or follow commands, no purposeful movement per RN, no posturing to deep pain stimulus, withdrawal both feet to plantar stimulus Clear breath sounds bilateral S1-S2 regular, sinus on monitor Soft nontender abdomen 1+ edema  Chest x-ray personally reviewed which shows no new infiltrates      RESOLVED PROBLEM LIST   Shock   ASSESSMENT AND PLAN    Acute respiratory failure with hypoxia and compromised airway. -Staph Aureus Pneumonia - s/p course of ancef for staph aureus PNA -Now on cefepime for Acinetobacter in sputum   Plan -Tolerating SBT's -Not a candidate for extubation given poor neuro status   Anoxic encephalopathy Bilateral  Watershed border zone infarcts  -Watershed infarct pattern on MRI   Plan - Appreciate neurology's recs regarding watershed infarct, neurology has signed off  - Minimize sedation as possible, RASS goal 0 - Continue neuro checks  - atorvastatin  -Poor prognosis given to family for meaningful neurologic recovery  Bipolar disorder Substance  abuse Insomnia Plan -Minimize sedating agents as possible, PRN versed and PRN fentanyl -No home antipsychotics at this time g  Acute Kidney Injury -Resolved   Transaminitis, improving = -Elevated LFTs from hypoxia, shock.  Resolved   Constipation P BID senokot QD colace QD PRN dulcolax   Hypertension P Continue TID labetalol and PRN labetalol   Malnutrition, at risk P -Contine EN   Goals of Care: -Ongoing goals of care discussion with palliative care.  Comfort care extubation at some point when family is ready     Best Practice  / Disposition.   Pain and Sedation: PRN fentanyl, PRN versed  DVT PROPHYLAXIS: SQ hepain SUP: Protonix  NUTRITION: EN  MOBILITY: Bed rest CODE STATUS: Full FAMILY DISCUSSIONS: Last updated 3/20 DISPOSITION: ICU   The patient is critically ill with multiple organ systems failure and requires high complexity decision making for assessment and support, frequent evaluation and titration of therapies, application of advanced monitoring technologies and extensive interpretation of multiple databases. Critical Care Time devoted to patient care services described in this note independent of APP/resident  time is 31 minutes.     Cyril Mourning MD. Tonny Bollman. [facility] Pager [phone] If no response call [phone]   01/13/2019   01/13/2019 11:26 AM

## 2019-01-14 LAB — GLUCOSE, CAPILLARY
GLUCOSE-CAPILLARY: 126 mg/dL — AB (ref 70–99)
Glucose-Capillary: 104 mg/dL — ABNORMAL HIGH (ref 70–99)
Glucose-Capillary: 129 mg/dL — ABNORMAL HIGH (ref 70–99)
Glucose-Capillary: 111 mg/dL — ABNORMAL HIGH (ref 70–99)
Glucose-Capillary: 119 mg/dL — ABNORMAL HIGH (ref 70–99)

## 2019-01-14 NOTE — Progress Notes (Signed)
  Patient seen, chart reviewed.  No family is at the bedside.  Spoke to patient's husband on 01/13/2019 where he informed me they are trying to gather multiple family members from [place] of [place] and hope to be able to pursue with full comfort care, liberation from the vent either late today or tomorrow.  Did asked bedside RN to call me if family comes to the bedside today otherwise palliative medicine team will be available to assist on 01/15/2019 Thank you Eduard Roux, NP

## 2019-01-14 NOTE — Progress Notes (Signed)
RT note: patient placed back on full support ventilation due to backup ventilation alarming.  Currently tolerating well.  Will continue to monitor.

## 2019-01-14 NOTE — Progress Notes (Signed)
RT note: patient placed on CPAP/PSV of 10/5 at 0754.  Currently tolerating well.  Will continue to monitor.

## 2019-01-14 NOTE — Progress Notes (Signed)
PULMONARY / CRITICAL CARE MEDICINE   NAME:  Rita Santos, MRN:  [id number], DOB:  November 25, 1960, LOS: 14 ADMISSION DATE:  01/09/2019, CONSULTATION DATE:  12/29/2018 REFERRING MD:  Dr. Jeraldine Loots, ER, CHIEF COMPLAINT:  Respiratory failure  BRIEF HISTORY:     History from ER staff and patient's coworker.  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  Received bystander CPR for about 10 minutes and then 2 minutes by fire department before ROSC.  Received narcan in field w/o response.  Intubated in ER.   Head CT and exam consistent with anoxic injury  SIGNIFICANT PAST MEDICAL HISTORY   Bipolar, Chronic opiate medication use, HTN, Allergies, Insomnia  SIGNIFICANT EVENTS:  3/08 Admit  3/18 - New leukocytosis and T 101 overnight Neuro status remains poor   3/19 - er pmt - family leaning towards comfort but not formalized yet.Remains unresponsive on vent,. STill full code though and family not around today . Palliative rounding  STUDIES:   CT head 3/08 >> small vessel white matter disease  01/02/2019 MRI head.  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  She cortical-based infarcts.  Findings favor drug-induced vasculitis/vasospasm.  Left middle frontal gyrus cortical signal abnormality could questionably be ischemic or possibly seizure activity. 3/12 EEG> continuous slow generalized activity. Moderate diffuse encephalopathy. No seizures recorded.   CULTURES:  Blood 3/08 >> MRSA PCR 3/8 - POSITIVe Sputum 3/08 >> staph aureus>> MSSA Influenza PCR 3/08 >> neg Respiratory viral panel 3/08 >> neg Pneumococcal Ag 3/08 >> 12/2018+ MRSA screen nasopharynx ............. 3./18 - acenitobacter ANTIBIOTICS:  Vancomycin 3/08 >>3/11 Zosyn 3/08 >> 3/11 Ancef 3/11> 3/14 Vanc 3/18> 3/20 Cefepime 3/18 (acenitobacer)> LINES/TUBES:  ETT 3/08 >>   CONSULTANTS:   Neurology stroke service (now signed off)  Palliative Care Medicine (consult placed 3/16)  SUBJECTIVE:  Remains intubated, comatose Afebrile    CONSTITUTIONAL: BP (!) 150/98   Pulse 85   Temp 98.7 F (37.1 C) (Oral)   Resp (!) 27   Ht 5\' 6"  (1.676 m)   Wt 81.5 kg   SpO2 100%   BMI 29.00 kg/m   I/O last 3 completed shifts: In: 3016.4 [I.V.:336.3; NG/GT:2380; IV Piggyback:300.1] Out: 2160 [Urine:2160]     Vent Mode: PSV;CPAP FiO2 (%):  [40 %] 40 % Set Rate:  [20 bmp] 20 bmp Vt Set:  [470 mL] 470 mL PEEP:  [5 cmH20] 5 cmH20 Pressure Support:  [5 cmH20-10 cmH20] 10 cmH20 Plateau Pressure:  [14 cmH20-17 cmH20] 17 cmH20  PHYSICAL EXAM:  -  Exam  unchanged from 3/21  Acutely ill, middle-aged woman, no distress No pallor, icterus, no JVD Minimal secretions Has oral bite-block and ET tube Eyes flicker when her name is called but does not really track or follow commands, no purposeful movement , mild extensor posturing to deep pain stimulus, withdrawal both feet to plantar stimulus Clear breath sounds bilateral S1-S2 regular, sinus on monitor Soft nontender abdomen 1+ edema  Chest x-ray from 3/20 personally reviewed which shows no new infiltrates or effusions   RESOLVED PROBLEM LIST   Shock  Acute Kidney Injury  Transaminitis, shock liver  ASSESSMENT AND PLAN    Acute respiratory failure with hypoxia and compromised airway. -Staph Aureus Pneumonia - s/p course of ancef for staph aureus PNA -Now on cefepime for Acinetobacter in sputum   Plan -Tolerating SBT's   Anoxic encephalopathy Bilateral  Watershed border zone infarcts  -Watershed infarct pattern on MRI   Plan -RASS goal 0, only needing intermittent fentanyl -Poor prognosis given to family for meaningful neurologic recovery  Bipolar disorder Substance abuse Insomnia Plan -Minimize sedating agents as possible, PRN fentanyl -No home antipsychotics at this time   Constipation P BID  senokot QD colace QD PRN dulcolax   Hypertension P Continue TID labetalol and PRN labetalol   Malnutrition, at risk P -Contine TFs  Goals of Care: -DNR issued.  She is at 14 days of mechanical ventilation.  Plan is for extubation one-way to comfort whenever family has come to terms and is ready.  My critical care time x 75m       Cyril Mourning MD. FCCP. [place] [facility] Pager [phone] If no response call [phone]   01/14/2019 11:48 AM

## 2019-01-15 ENCOUNTER — Encounter (HOSPITAL_COMMUNITY): Payer: Self-pay

## 2019-01-15 DIAGNOSIS — Z7189 Other specified counseling: Secondary | ICD-10-CM

## 2019-01-15 DIAGNOSIS — Z515 Encounter for palliative care: Secondary | ICD-10-CM

## 2019-01-15 LAB — CULTURE, BLOOD (ROUTINE X 2)
Culture: NO GROWTH
Culture: NO GROWTH

## 2019-01-15 LAB — GLUCOSE, CAPILLARY
Glucose-Capillary: 112 mg/dL — ABNORMAL HIGH (ref 70–99)
Glucose-Capillary: 114 mg/dL — ABNORMAL HIGH (ref 70–99)
Glucose-Capillary: 126 mg/dL — ABNORMAL HIGH (ref 70–99)

## 2019-01-15 MED ORDER — LORAZEPAM 2 MG/ML IJ SOLN: 2.0000 mg | INTRAMUSCULAR | Status: DC | PRN

## 2019-01-15 MED ORDER — POLYVINYL ALCOHOL 1.4 % OP SOLN
1.0000 [drp] | Freq: Four times a day (QID) | OPHTHALMIC | Status: DC | PRN
  Filled 2019-01-15: qty 15

## 2019-01-15 MED ORDER — MORPHINE BOLUS VIA INFUSION
5.0000 mg | INTRAVENOUS | Status: DC | PRN
  Administered 2019-01-16 (×7): 5 mg via INTRAVENOUS
  Filled 2019-01-15: qty 5

## 2019-01-15 MED ORDER — LORAZEPAM 2 MG/ML IJ SOLN: 2.0000 mg | Freq: Two times a day (BID) | INTRAMUSCULAR | Status: DC | PRN

## 2019-01-15 MED ORDER — DEXTROSE 5 % IV SOLN: INTRAVENOUS | Status: DC

## 2019-01-15 MED ORDER — GLYCOPYRROLATE 0.2 MG/ML IJ SOLN
0.2000 mg | INTRAMUSCULAR | Status: DC | PRN
  Administered 2019-01-16: 0.2 mg via INTRAVENOUS
  Filled 2019-01-15: qty 1

## 2019-01-15 MED ORDER — ACETAMINOPHEN 325 MG PO TABS: 650.0000 mg | ORAL_TABLET | Freq: Four times a day (QID) | ORAL | Status: DC | PRN

## 2019-01-15 MED ORDER — MORPHINE 100MG IN NS 100ML (1MG/ML) PREMIX INFUSION
0.0000 mg/h | INTRAVENOUS | Status: DC
  Administered 2019-01-15: 5 mg/h via INTRAVENOUS
  Administered 2019-01-15: 15 mg/h via INTRAVENOUS
  Administered 2019-01-16 (×3): 30 mg/h via INTRAVENOUS
  Administered 2019-01-16: 20 mg/h via INTRAVENOUS
  Administered 2019-01-16: 15 mg/h via INTRAVENOUS
  Administered 2019-01-17: 30 mg/h via INTRAVENOUS
  Filled 2019-01-15 (×9): qty 100

## 2019-01-15 MED ORDER — GLYCOPYRROLATE 1 MG PO TABS
1.0000 mg | ORAL_TABLET | ORAL | Status: DC | PRN
  Filled 2019-01-15: qty 1

## 2019-01-15 MED ORDER — ACETAMINOPHEN 650 MG RE SUPP: 650.0000 mg | Freq: Four times a day (QID) | RECTAL | Status: DC | PRN

## 2019-01-15 MED ORDER — LORAZEPAM 2 MG/ML IJ SOLN
2.0000 mg | INTRAMUSCULAR | Status: DC | PRN
  Administered 2019-01-15: 4 mg via INTRAVENOUS
  Administered 2019-01-16 (×2): 2 mg via INTRAVENOUS
  Administered 2019-01-16: 4 mg via INTRAVENOUS
  Filled 2019-01-15 (×2): qty 1
  Filled 2019-01-15 (×2): qty 2

## 2019-01-15 MED ORDER — DIPHENHYDRAMINE HCL 50 MG/ML IJ SOLN: 25.0000 mg | INTRAMUSCULAR | Status: DC | PRN

## 2019-01-15 MED ORDER — GLYCOPYRROLATE 0.2 MG/ML IJ SOLN: 0.2000 mg | INTRAMUSCULAR | Status: DC | PRN

## 2019-01-15 MED ORDER — MORPHINE SULFATE (PF) 2 MG/ML IV SOLN: 2.0000 mg | INTRAVENOUS | Status: DC | PRN

## 2019-01-15 NOTE — Progress Notes (Signed)
RT note: patient meets criteria for SBT this AM however due to patient being a planned one way extubation this afternoon, will hold on SBT.  Will continue to monitor.

## 2019-01-15 NOTE — Progress Notes (Signed)
Nutrition Brief Note  Pt transitioning to comfort care. RD signing off at this time.  Please consult if needs change.  Jolaine Artist, MS, RDN, LDN Pager: [phone] Available Mondays and Fridays, 9am-2pm

## 2019-01-15 NOTE — Progress Notes (Signed)
PULMONARY / CRITICAL CARE MEDICINE   NAME:  Rita Santos, MRN:  [id number], DOB:  03-24-1961, LOS: 15 ADMISSION DATE:  01/03/2019, CONSULTATION DATE:  01/08/2019 REFERRING MD:  Dr. Jeraldine Loots, ER, CHIEF COMPLAINT:  Respiratory failure  BRIEF HISTORY:     History from ER staff and patient's coworker.  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  Received bystander CPR for about 10 minutes and then 2 minutes by fire department before ROSC.  Received narcan in field w/o response.  Intubated in ER.   Head CT and exam consistent with anoxic injury  SIGNIFICANT PAST MEDICAL HISTORY   Bipolar, Chronic opiate medication use, HTN, Allergies, Insomnia  SIGNIFICANT EVENTS:  3/08 Admit  3/18 - New leukocytosis and T 101 overnight Neuro status remains poor   3/19 - er pmt - family leaning towards comfort but not formalized yet.Remains unresponsive on vent,. STill full code though and family not around today . Palliative rounding  3/22> discussion between palliative care and family to transition to comfort care when family arrives (likely 3/23)   STUDIES:   CT head 3/08 >> small vessel white matter disease  01/02/2019 MRI head.  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  She cortical-based infarcts.  Findings favor drug-induced vasculitis/vasospasm.  Left middle frontal gyrus cortical signal abnormality could questionably be ischemic or possibly seizure activity. 3/12 EEG> continuous slow generalized activity. Moderate diffuse encephalopathy. No seizures recorded.  CXR 3/20> no new infiltrates or effusions  CULTURES:  Blood 3/08 >> MRSA PCR 3/8 - POSITIVe Sputum 3/08 >> staph aureus>> MSSA Influenza PCR 3/08 >> neg Respiratory viral panel 3/08 >> neg Pneumococcal Ag 3/08 >> 12/2018+ MRSA screen nasopharynx 3./18 - acenitobacter ANTIBIOTICS:  Vancomycin  3/08 >>3/11 Zosyn 3/08 >> 3/11 Ancef 3/11> 3/14 Vanc 3/18> 3/20 Cefepime 3/18 (acenitobacer)> LINES/TUBES:  ETT 3/08 >>   CONSULTANTS:  Neurology stroke service (now signed off)  Palliative Care Medicine (consult placed 3/16)  SUBJECTIVE:  Enteral Nutrition intolerance overnight  Remains intubated this morning. SBT deferred due to planned comfort care with 1-way extubation this afternoon  CONSTITUTIONAL: BP 131/73   Pulse 64   Temp 98.1 F (36.7 C) (Oral)   Resp 20   Ht 5\' 6"  (1.676 m)   Wt 83.1 kg   SpO2 100%   BMI 29.57 kg/m   I/O last 3 completed shifts: In: 1820 [NG/GT:1620; IV Piggyback:200] Out: 1850 [Urine:1850]     Vent Mode: PRVC FiO2 (%):  [40 %] 40 % Set Rate:  [20 bmp] 20 bmp Vt Set:  [470 mL] 470 mL PEEP:  [5 cmH20] 5 cmH20 Pressure Support:  [10 cmH20] 10 cmH20 Plateau Pressure:  [12 cmH20-14 cmH20] 12 cmH20  PHYSICAL EXAM:  -   General: Ill appearing adult female, intubated, NAD Neuro: Upward gaze. Does not follow commands. Some facial grimacing to noxious stimuli  HEENT: NCAT. Pink mmm. ETT secure, scant clear secretions Pulmonary: Symmetrical chest expansion. No increased work of breathing.  Cardiovascular: RRR, s1s2 no r/g/m.  Gastrointestinal: soft, round, non-distended. Normoactive x4 Integumentary: pale, clean, dry, warm, intact no rashes Extremity: symmetrical bulk and tone, trace lower extremity edema   RESOLVED PROBLEM LIST   Shock  Acute Kidney Injury  Transaminitis, shock liver  ASSESSMENT AND PLAN    Acute respiratory failure with hypoxia and compromised airway. -Staph Aureus Pneumonia - s/p course of ancef  for staph aureus PNA -Now on cefepime for Acinetobacter in sputum  Plan -Continue vent support -holding antibiotics now given change to comfort care, with withdrawal of care expected this afternoon -will be 1-way extubation   Anoxic encephalopathy -Bilateral watershed infarcts -watershed infarcts visualized on MRI   Plan -Poor neurologic prognosis -Will pursue comfort care when family arrives -Appreciate Palliative Medicine assistance   Bipolar disorder Substance abuse Insomnia Plan -Holding home medications with upcoming transition to comfort care   Constipation P -can hold bowel regimen given upcoming transition to comfort care   Hypertension P -Will continue labetalol until family arrives  Malnutrition, at risk P -can discontinue enteral nutrition in setting of upcoming transition to comfort care   Goals of Care: -DNR -Comfort care and 1-way extubation anticipated 3/23 when family arrives this afternoon -Appreciate palliative assitance    Critical Care Time: 30 minutes  Tessie Fass MSN, AGACNP-BC [place] [facility]/Critical Care Medicine [phone] If no answer, [phone] 01/15/2019, 9:26 AM

## 2019-01-15 NOTE — Progress Notes (Signed)
   01/15/19 1300  Clinical Encounter Type  Visited With Patient and family together  Visit Type Initial  Referral From Palliative care team;Nurse  Consult/Referral To Chaplain  Spiritual Encounters  Spiritual Needs Emotional;Prayer;Literature  Stress Factors  Patient Stress Factors None identified  Family Stress Factors Loss of control;Major life changes;Loss   Responded to spiritual care consult from palliative care team and [facility]. Family was present at bedside during extubation. I offered spiritual care with words of comfort, empathic listening, ministry of presence, sacred text reading and prayer. NP and nurse were present and tending pt and family. Chaplain noticed compassion and professionalism. Family was very thankful for Chaplain presence.  Chaplain Orest Dikes  [phone]

## 2019-01-15 NOTE — Progress Notes (Signed)
Patient extubated per family request per MD order.  Patient tolerated well.  Will continue to monitor.

## 2019-01-15 NOTE — Progress Notes (Signed)
Palliative:   Rita Santos is lying quietly in bed.  She is intubated/ventilated and appears comfortable.  Family has elected compassionate extubation, let nature take its course.   Conference with nursing staff and RT related to plan for compassionate extubation.  Conference with chaplan related to patient/family needs.   Family arrives, spends time with Rita Santos, compassionate extubation completed. Support to family and staff.   70 minutes, extended time  Lillia Carmel, NP  Palliative Medicine Team  Team Phone # [phone]  Greater than 50% of this time was spent counseling and coordinating care related to the above assessment and plan.

## 2019-01-16 MED ORDER — HYDROMORPHONE HCL 1 MG/ML IJ SOLN
1.0000 mg | INTRAMUSCULAR | Status: DC | PRN
  Administered 2019-01-16 (×4): 1 mg via INTRAVENOUS
  Filled 2019-01-16 (×4): qty 1

## 2019-01-16 NOTE — Progress Notes (Signed)
Gave report to [place] from 6N. Patient's daughter updated, Brett Albino.

## 2019-01-16 NOTE — Progress Notes (Addendum)
Received patient as a transfer to 6N. Patient lying in bed, appears to be very comfortable. Patient's 3 children are at the bedside. Will continue to monitor.

## 2019-01-16 NOTE — Progress Notes (Signed)
PCCM Progress Note  Summary:  is a 58 y.o. female admitted on 01/20/2019 with altered mental status and respiratory leading to cardiac arrest.  Intubated for airway protection.  UDS positive for opiates, cocaine.  Treated for MSSA pneumonia and Acinetobacter pneumonia.  Poor mental status.  Seen by neurology, and neuro imaging consistent with anoxic encephalopathy.  Palliative care consulted.  DNR.  Transitioned to comfort measures and extubated 3/23.  Subjective: Remains on morphine gtt with intermittent dilaudid.  Vital signs: BP (!) 161/103 (BP Location: Left Arm)   Pulse 73   Temp 98.1 F (36.7 C) (Oral)   Resp 15   Ht 5\' 6"  (1.676 m)   Wt 83.1 kg   SpO2 97%   BMI 29.57 kg/m   Physical exam: General - somnolent Eyes - pupils constricted ENT - snoring Cardiac - regular rate/rhythm, no murmur Chest - diminished breath sounds Abdomen - soft, non tender, decreased bowel sounds Extremities - 1+ edema Skin - no rashes Neuro - unresponsive  Assessment: Acute respiratory failure with hypoxia Compromised airway Staph aureus pneumonia present prior to admission Acinetobacter HCAP developed in hospital Acute anoxic encephalopathy with b/l watershed infarcts  Acute kidney injury from ATN Elevated LFTs from shock and anoxia Substance abuse with UDS positive for cocaine Anemia of critical illness History of bipolar History of hypertension  Plan: DNR, comfort measures Transfer to floor bed Palliative care following Prn tylenol, benadryl, dilaudid, robinul, ativan  Coralyn Helling, MD [facility] Pulmonary/Critical Care 01/16/2019, 10:00 AM

## 2019-01-16 NOTE — Progress Notes (Addendum)
Palliative:  Rita Santos is now FULL COMFORT care.  She is lying quietly in bed, and does not respond to me in any way.  Overall she appears comfortable, but her RR is above 20.  There is no family at bedside at this time.  Rita Santos appears to be close to passing, hospital death is anticipated.   Conference with nursing staff related to comfort care. Comfort medications adjusted.  Encouraged to call PMT as needed.   Visited patient on [address] 3 adult children at bedside, they verbalized that they believe their mother's time is short.  They state that she is having periods of apnea and her color has recently changed.  I reassure them that she seems comfortable and we have medications to ensure that she does not suffer.  Patient and family supported.  25 minutes  Lillia Carmel, NP  Palliative Medicine Team  Team Phone # [phone]  Greater than 50% of this time was spent counseling and coordinating care related to the above assessment and plan.

## 2019-01-18 ENCOUNTER — Telehealth: Payer: Self-pay

## 2019-01-18 NOTE — Telephone Encounter (Signed)
Received dc from [facility] (original). DC is for cremation. Patient is a patient of Doctor Sood. DC will be taken to [facility] for signature.

## 2019-01-22 NOTE — Telephone Encounter (Signed)
01/22/19 received D/C back from Dr.Sood. Will call [employer] to pick up . PWR

## 2019-01-24 NOTE — Progress Notes (Signed)
12:55  Pt.expired. Verified with Kathyrn Sheriff, RN   0100   Called and informed daughter, undecided with funeral plans  0111 Called and informed MD, spoke to Dr. Warrick Parisian, he stated that Dr. Craige Cotta will sign the death certificate in the morning  0135 Wasted 90 mg of morphine at stericycle with Kathyrn Sheriff, RN  0200 Body preparation completed  [phone] Transported body to morgue by [facility]

## 2019-01-24 NOTE — Death Summary Note (Signed)
Rita Santos was a 58 y.o. female admitted on 12/28/2018 with altered mental status and respiratory leading to cardiac arrest.  Intubated for airway protection.  UDS positive for opiates, cocaine.  Treated for MSSA pneumonia and Acinetobacter pneumonia.  Poor mental status.  Seen by neurology, and neuro imaging consistent with anoxic encephalopathy.  Palliative care consulted.  DNR.  Transitioned to comfort measures and extubated 3/23.  She expired on 2019-02-14 at 12:55 AM.  Final diagnoses: Acute respiratory failure with hypoxia Compromised airway Staph aureus pneumonia present prior to admission Acinetobacter HCAP developed in hospital Acute anoxic encephalopathy with b/l watershed infarcts  Acute kidney injury from ATN Elevated LFTs from shock and anoxia Substance abuse with UDS positive for cocaine Anemia of critical illness History of bipolar History of hypertension  Coralyn Helling, MD [facility] Pulmonary/Critical Care 02-14-19, 12:12 PM

## 2019-01-24 DEATH — deceased

## 2019-09-26 IMAGING — DX PORTABLE CHEST - 1 VIEW
1 series · 1 of 1 positions shown · non-contrast
Comparison: 01/08/2019

CLINICAL DATA: Respiratory failure.

EXAM:
PORTABLE CHEST 1 VIEW

[chest]
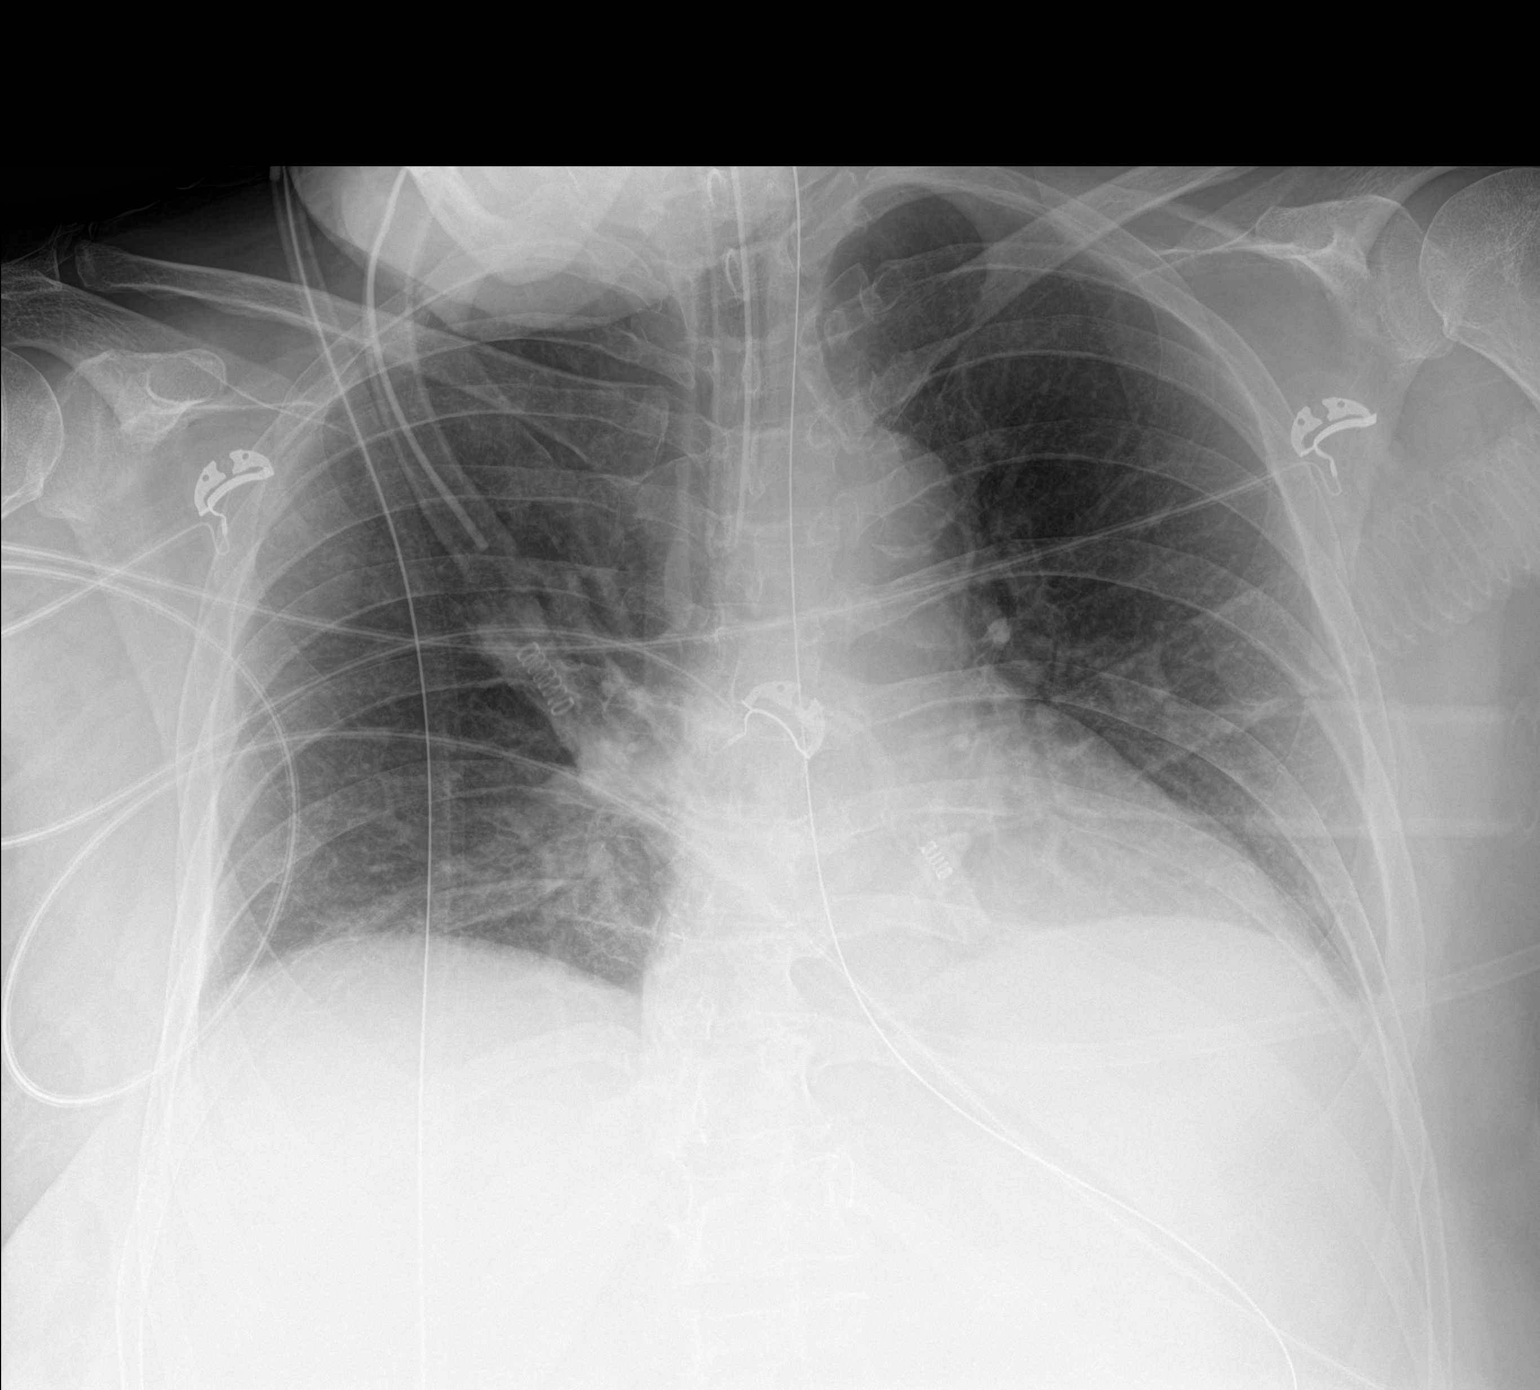

[1 of 1 positions shown; findings below may reference images not displayed]

FINDINGS: Endotracheal tube is 1.5 cm above the carina. Nasogastric tube
extends into the abdomen but the tip is beyond the image. Again
noted are hazy densities in the medial right lower lobe probably
representing atelectasis. Otherwise, the lungs are clear. Negative
for a pneumothorax. Heart size is normal and stable.
IMPRESSION: Stable position of the endotracheal tube. Endotracheal tube is
appropriately positioned.

Few densities at the medial right lung base may represent
atelectasis. Otherwise, the lungs are clear.
